# Patient Record
Sex: Female | Born: 1965 | Race: White | Hispanic: No | Marital: Married | Smoking: Never smoker
Health system: Southern US, Community
[De-identification: ages and names within clinical notes are randomized; demographics above are authoritative.]

## PROBLEM LIST (undated history)

## (undated) DIAGNOSIS — G43909 Migraine, unspecified, not intractable, without status migrainosus: Secondary | ICD-10-CM

## (undated) DIAGNOSIS — F329 Major depressive disorder, single episode, unspecified: Secondary | ICD-10-CM

## (undated) DIAGNOSIS — E538 Deficiency of other specified B group vitamins: Secondary | ICD-10-CM

## (undated) DIAGNOSIS — C801 Malignant (primary) neoplasm, unspecified: Secondary | ICD-10-CM

## (undated) DIAGNOSIS — R222 Localized swelling, mass and lump, trunk: Secondary | ICD-10-CM

## (undated) DIAGNOSIS — K5909 Other constipation: Secondary | ICD-10-CM

## (undated) DIAGNOSIS — R42 Dizziness and giddiness: Secondary | ICD-10-CM

## (undated) DIAGNOSIS — F32A Depression, unspecified: Secondary | ICD-10-CM

## (undated) DIAGNOSIS — N200 Calculus of kidney: Secondary | ICD-10-CM

## (undated) DIAGNOSIS — F419 Anxiety disorder, unspecified: Secondary | ICD-10-CM

## (undated) DIAGNOSIS — D649 Anemia, unspecified: Secondary | ICD-10-CM

## (undated) HISTORY — DX: Migraine, unspecified, not intractable, without status migrainosus: G43.909

## (undated) HISTORY — PX: KNEE SURGERY: SHX244

## (undated) HISTORY — DX: Localized swelling, mass and lump, trunk: R22.2

## (undated) HISTORY — DX: Anxiety disorder, unspecified: F41.9

## (undated) HISTORY — PX: OTHER SURGICAL HISTORY: SHX169

## (undated) HISTORY — PX: MODIFIED RADICAL MASTECTOMY: SHX5703

## (undated) HISTORY — DX: Calculus of kidney: N20.0

## (undated) HISTORY — PX: SURGERY OF LIP: SUR1315

## (undated) HISTORY — DX: Other constipation: K59.09

## (undated) HISTORY — PX: HYSTEROSCOPY: SHX211

## (undated) HISTORY — DX: Deficiency of other specified B group vitamins: E53.8

## (undated) HISTORY — PX: BREAST RECONSTRUCTION: SHX9

## (undated) HISTORY — PX: EYE SURGERY: SHX253

## (undated) HISTORY — PX: DILATION AND CURETTAGE OF UTERUS: SHX78

## (undated) HISTORY — DX: Dizziness and giddiness: R42

## (undated) HISTORY — DX: Anemia, unspecified: D64.9

---

## 2008-10-31 ENCOUNTER — Emergency Department: Payer: Self-pay | Admitting: Emergency Medicine

## 2008-12-16 ENCOUNTER — Ambulatory Visit: Payer: Self-pay | Admitting: Gastroenterology

## 2009-07-15 IMAGING — CR DG ABDOMEN 2V
1 series · 2 of 2 positions shown · non-contrast
Comparison: none

COMMENTS:
REASON FOR EXAM: Abdominal pain

[Series 1: view not recorded · 0.17mm/px · 2 of 2 slices shown]
[im 1/2]
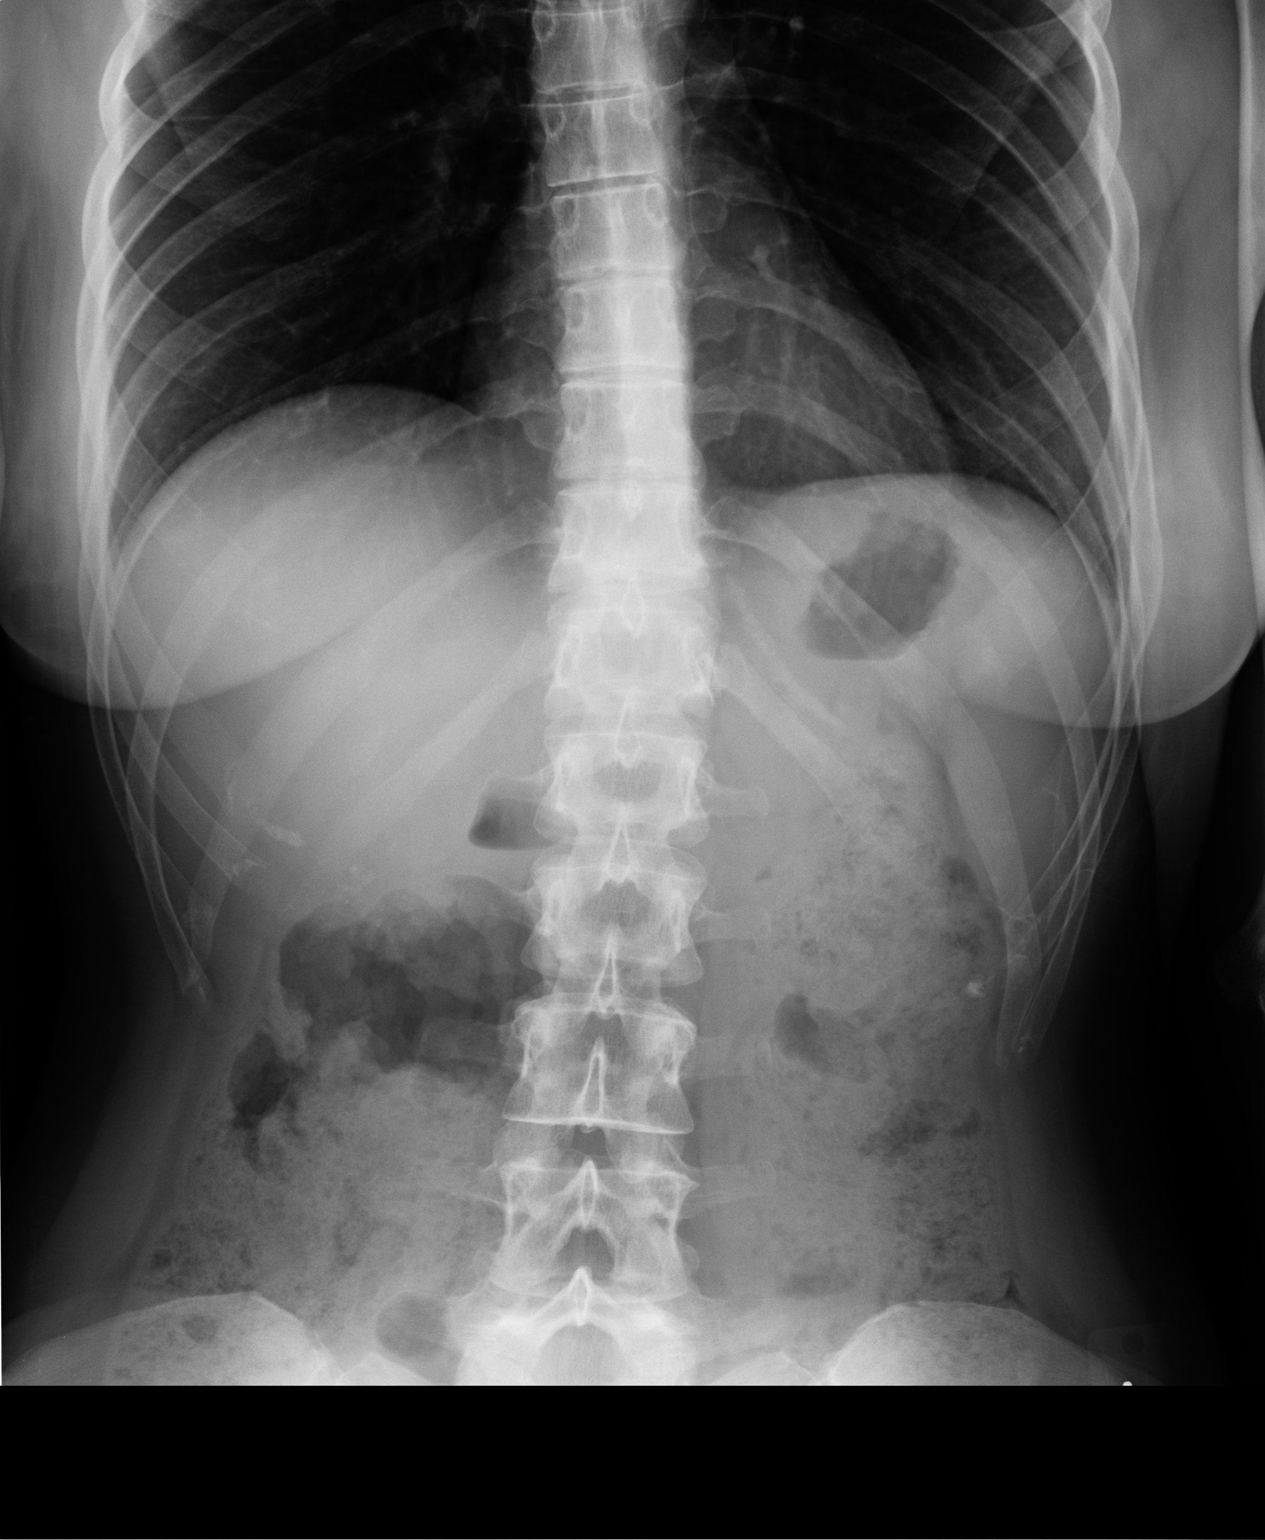
[im 2/2]
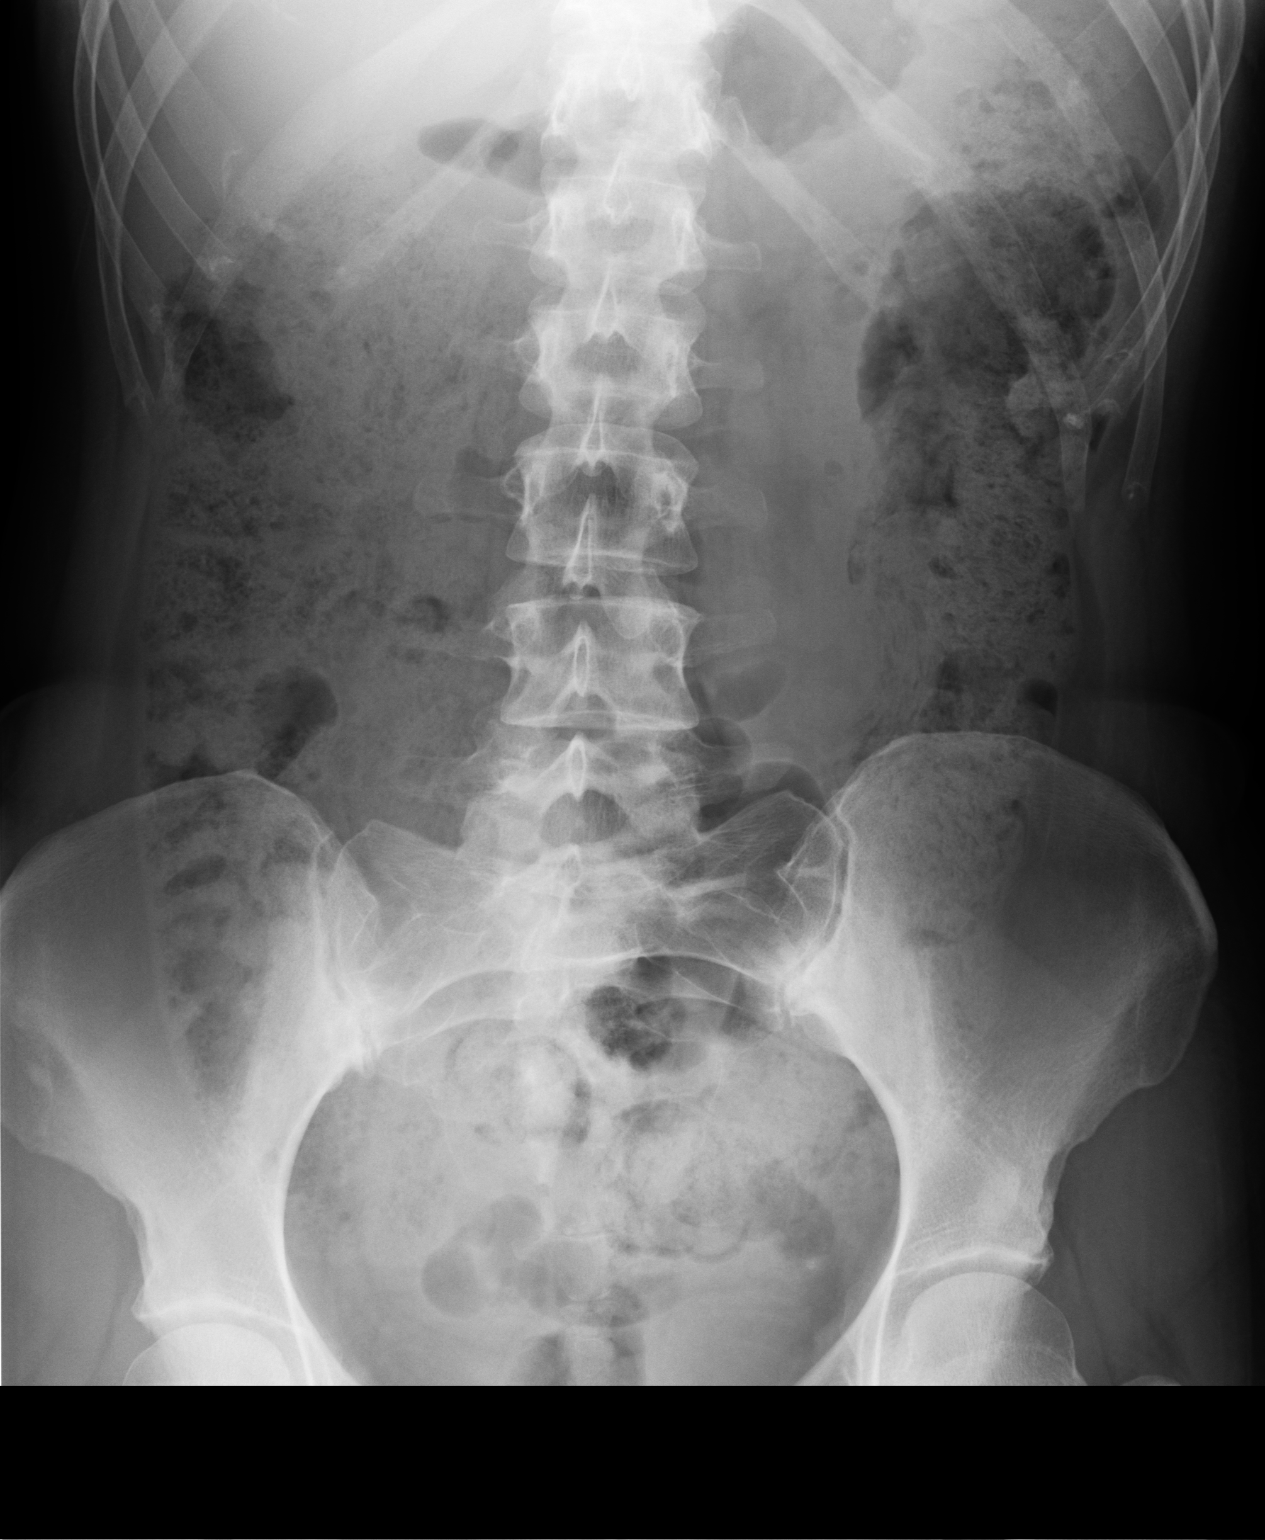

[2 of 2 positions shown; findings below may reference images not displayed]

PROCEDURE:          DXR ABDOMEN 2 V FLAT AND ERECT 10/31/2008 [DATE]

RESULT:     The lung bases are clear. Air and stool are scattered through
the colon to the rectum. There is no abnormal gastric or small bowel
distention. No free air is appreciated. No definite radiopaque urinary tract
calculi are seen.
IMPRESSION: No bowel obstruction or perforation. There does appear to
be mild constipation.

## 2010-06-02 ENCOUNTER — Emergency Department: Payer: Self-pay | Admitting: Emergency Medicine

## 2010-10-31 ENCOUNTER — Emergency Department: Payer: Self-pay | Admitting: Emergency Medicine
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
## 2012-08-06 ENCOUNTER — Ambulatory Visit: Payer: Self-pay | Admitting: Oncology

## 2012-08-06 ENCOUNTER — Ambulatory Visit: Payer: Self-pay | Admitting: Hematology and Oncology

## 2012-08-22 ENCOUNTER — Ambulatory Visit: Payer: Self-pay | Admitting: Hematology and Oncology

## 2012-08-22 ENCOUNTER — Ambulatory Visit: Payer: Self-pay | Admitting: Oncology

## 2012-08-27 LAB — CBC CANCER CENTER
Basophil #: 0.1 x10 3/mm (ref 0.0–0.1)
Basophil %: 1.5 %
Eosinophil %: 1 %
HGB: 11.7 g/dL — ABNORMAL LOW (ref 12.0–16.0)
Lymphocyte #: 1.4 x10 3/mm (ref 1.0–3.6)
MCH: 26.2 pg (ref 26.0–34.0)
MCHC: 32.2 g/dL (ref 32.0–36.0)
MCV: 81 fL (ref 80–100)
Neutrophil #: 2.3 x10 3/mm (ref 1.4–6.5)
Neutrophil %: 55.5 %
Platelet: 176 x10 3/mm (ref 150–440)
RBC: 4.47 10*6/uL (ref 3.80–5.20)
WBC: 4.1 x10 3/mm (ref 3.6–11.0)

## 2012-08-27 LAB — IRON AND TIBC
Iron Bind.Cap.(Total): 345 ug/dL (ref 250–450)
Iron Saturation: 23 %

## 2012-09-22 ENCOUNTER — Ambulatory Visit: Payer: Self-pay | Admitting: Oncology

## 2012-10-26 ENCOUNTER — Ambulatory Visit: Payer: Self-pay | Admitting: Oncology

## 2012-10-26 LAB — CBC CANCER CENTER
Eosinophil #: 0.1 x10 3/mm (ref 0.0–0.7)
Eosinophil %: 1.7 %
HCT: 39.8 % (ref 35.0–47.0)
Lymphocyte %: 31 %
MCH: 27.6 pg (ref 26.0–34.0)
MCHC: 32 g/dL (ref 32.0–36.0)
Monocyte #: 0.3 x10 3/mm (ref 0.2–0.9)
Monocyte %: 6.7 %
Neutrophil %: 56.9 %
Platelet: 215 x10 3/mm (ref 150–440)
WBC: 5.1 x10 3/mm (ref 3.6–11.0)

## 2012-10-26 LAB — IRON AND TIBC
Iron Bind.Cap.(Total): 338 ug/dL (ref 250–450)
Iron Saturation: 24 %
Iron: 80 ug/dL (ref 50–170)
Unbound Iron-Bind.Cap.: 258 ug/dL

## 2012-10-26 LAB — FERRITIN: Ferritin (ARMC): 77 ng/mL (ref 8–388)

## 2012-11-20 ENCOUNTER — Ambulatory Visit: Payer: Self-pay | Admitting: Oncology

## 2013-01-24 ENCOUNTER — Ambulatory Visit: Payer: Self-pay | Admitting: Oncology

## 2013-07-23 ENCOUNTER — Ambulatory Visit: Payer: Self-pay | Admitting: Obstetrics and Gynecology

## 2013-07-23 LAB — CBC
HCT: 38.1 % (ref 35.0–47.0)
HGB: 12.8 g/dL (ref 12.0–16.0)
Platelet: 185 10*3/uL (ref 150–440)
RDW: 13.1 % (ref 11.5–14.5)

## 2013-07-23 LAB — BASIC METABOLIC PANEL
Anion Gap: 6 — ABNORMAL LOW (ref 7–16)
BUN: 10 mg/dL (ref 7–18)
Calcium, Total: 8.9 mg/dL (ref 8.5–10.1)
Co2: 28 mmol/L (ref 21–32)
Creatinine: 0.69 mg/dL (ref 0.60–1.30)
Osmolality: 277 (ref 275–301)
Potassium: 4.1 mmol/L (ref 3.5–5.1)

## 2013-07-29 ENCOUNTER — Ambulatory Visit: Payer: Self-pay | Admitting: Obstetrics and Gynecology

## 2014-03-03 ENCOUNTER — Ambulatory Visit: Payer: Self-pay | Admitting: Oncology

## 2014-03-22 ENCOUNTER — Ambulatory Visit: Payer: Self-pay | Admitting: Oncology

## 2014-05-08 ENCOUNTER — Ambulatory Visit: Payer: Self-pay | Admitting: Oncology

## 2014-05-14 LAB — HM PAP SMEAR: HM PAP: NEGATIVE

## 2014-05-16 LAB — CBC CANCER CENTER
Basophil #: 0.1 x10 3/mm (ref 0.0–0.1)
Basophil %: 1 %
EOS PCT: 1.9 %
Eosinophil #: 0.2 x10 3/mm (ref 0.0–0.7)
HCT: 39.6 % (ref 35.0–47.0)
HGB: 13.1 g/dL (ref 12.0–16.0)
Lymphocyte #: 1.7 x10 3/mm (ref 1.0–3.6)
Lymphocyte %: 16.4 %
MCH: 29.5 pg (ref 26.0–34.0)
MCHC: 33 g/dL (ref 32.0–36.0)
MCV: 90 fL (ref 80–100)
MONO ABS: 0.6 x10 3/mm (ref 0.2–0.9)
Monocyte %: 6 %
Neutrophil #: 7.5 x10 3/mm — ABNORMAL HIGH (ref 1.4–6.5)
Neutrophil %: 74.7 %
Platelet: 222 x10 3/mm (ref 150–440)
RBC: 4.42 10*6/uL (ref 3.80–5.20)
RDW: 16.1 % — ABNORMAL HIGH (ref 11.5–14.5)
WBC: 10.1 x10 3/mm (ref 3.6–11.0)

## 2014-05-16 LAB — IRON AND TIBC
IRON: 34 ug/dL — AB (ref 50–170)
Iron Bind.Cap.(Total): 267 ug/dL (ref 250–450)
Iron Saturation: 13 %
Unbound Iron-Bind.Cap.: 233 ug/dL

## 2014-05-16 LAB — FERRITIN: Ferritin (ARMC): 124 ng/mL (ref 8–388)

## 2014-05-22 ENCOUNTER — Ambulatory Visit: Payer: Self-pay | Admitting: Oncology

## 2014-09-16 ENCOUNTER — Ambulatory Visit: Payer: Self-pay | Admitting: Oncology

## 2014-09-16 LAB — IRON AND TIBC
IRON BIND. CAP.(TOTAL): 315 ug/dL (ref 250–450)
Iron Saturation: 22 %
Iron: 70 ug/dL (ref 50–170)
Unbound Iron-Bind.Cap.: 245 ug/dL

## 2014-09-16 LAB — CBC CANCER CENTER
BASOS ABS: 0.1 x10 3/mm (ref 0.0–0.1)
Basophil %: 1 %
Eosinophil #: 0.1 x10 3/mm (ref 0.0–0.7)
Eosinophil %: 1.7 %
HCT: 40 % (ref 35.0–47.0)
HGB: 13.1 g/dL (ref 12.0–16.0)
LYMPHS ABS: 1.8 x10 3/mm (ref 1.0–3.6)
Lymphocyte %: 28 %
MCH: 29.3 pg (ref 26.0–34.0)
MCHC: 32.9 g/dL (ref 32.0–36.0)
MCV: 89 fL (ref 80–100)
MONOS PCT: 5.7 %
Monocyte #: 0.4 x10 3/mm (ref 0.2–0.9)
Neutrophil #: 4.1 x10 3/mm (ref 1.4–6.5)
Neutrophil %: 63.6 %
Platelet: 219 x10 3/mm (ref 150–440)
RBC: 4.49 10*6/uL (ref 3.80–5.20)
RDW: 12.9 % (ref 11.5–14.5)
WBC: 6.4 x10 3/mm (ref 3.6–11.0)

## 2014-09-16 LAB — FERRITIN: Ferritin (ARMC): 59 ng/mL (ref 8–388)

## 2014-09-22 ENCOUNTER — Ambulatory Visit: Payer: Self-pay | Admitting: Oncology

## 2014-12-12 NOTE — Op Note (Signed)
PATIENT NAME:  VERDEAN, MURIN MR#:  [id number] DATE OF BIRTH:  Mar 13, 1966  DATE OF PROCEDURE:  07/29/2013  PREOPERATIVE DIAGNOSES: 1. Abnormal uterine bleeding.  2. History of breast cancer, on tamoxifen therapy.   POSTOPERATIVE DIAGNOSES:  1. Abnormal uterine bleeding.  2. History of breast cancer, on tamoxifen therapy.   OPERATIVE PROCEDURE: Hysteroscopy with dilation and curettage.   SURGEON: Brayton Mars, M.D.   FIRST ASSISTANT: None.   ANESTHESIA: General.   INDICATIONS: The patient is a 49 year old white female, status post breast cancer treatment, currently on tamoxifen therapy, who presents for surgical evaluation of abnormal uterine bleeding. Preoperative ultrasound demonstrated a thickened echogenic endometrium.   FINDINGS AT SURGERY: Revealed pelvic organ prolapse with the uterus being of normal size and shape with the cervix extending to the introitus with tenaculum traction. No adnexal masses were appreciated. There was a second-degree cystocele present. Pelvis is gynecoid. On hysteroscopy normal endometrial cavity was identified. There was a question of possible submucosal anterior fibroid during curettage.   DESCRIPTION OF PROCEDURE: The patient was brought to the Operating Room where she was placed in the supine position. General anesthesia was induced without difficulty. She was placed in dorsal lithotomy position using candycane stirrups. A Betadine perineal, intravaginal prep and drape was performed in standard fashion. Red Robinson catheter was used to drain 200 mL of urine from the bladder. A single-tooth tenaculum was placed on the edge of the cervix. A weighted speculum was placed into the vagina. The uterus was sounded to 10 cm and was noted to be in the mid position location. Hanks dilators up to a #20 [place] caliber were used to dilate the endocervical canal. The [facility] hysteroscope using lactated Ringer's as irrigant was used to assess the intrauterine cavity.  Curettage was performed of the endometrium with both smooth and serrated curettes. Stone polyp forceps were likewise used to remove residual tissue left behind. Repeat hysteroscopy demonstrated no significant residual tissue. Upon completion of the procedure, all instrumentation was removed from the vagina. The patient was then awakened, mobilized and taken to recovery room in satisfactory condition.   ESTIMATED BLOOD LOSS: 25 mL.   IV FLUIDS: 700 mL.   URINE OUTPUT: Was 200 mL.   All instruments, needle, and sponge counts were verified as correct.   ADDENDUM: This patient is an excellent Trans Vaginal Hysterectomy candidate if hysterectomy becomes necessary.    ____________________________ Alanda Slim Javid Kemler, MD mad:sg D: 07/29/2013 13:30:35 ET T: 07/29/2013 14:12:53 ET JOB#: [id number]  cc: Hassell Done A. Jamil Armwood, MD, <Dictator> Alanda Slim Taneka Espiritu MD ELECTRONICALLY SIGNED 07/30/2013 17:05

## 2014-12-17 ENCOUNTER — Ambulatory Visit
Admit: 2014-12-17 | Disposition: A | Discharge status: Home / Self Care | Payer: Self-pay | Attending: Oncology | Admitting: Oncology

## 2014-12-17 LAB — IRON AND TIBC
IRON: 75 ug/dL
Iron Bind.Cap.(Total): 361 (ref 250–450)
Iron Saturation: 20.8
Unbound Iron-Bind.Cap.: 286.1

## 2014-12-17 LAB — FERRITIN: Ferritin (ARMC): 26 ng/mL

## 2014-12-17 LAB — CBC CANCER CENTER
BASOS ABS: 0 x10 3/mm (ref 0.0–0.1)
Basophil %: 0.2 %
EOS ABS: 0.2 x10 3/mm (ref 0.0–0.7)
Eosinophil %: 2.5 %
HCT: 39.8 % (ref 35.0–47.0)
HGB: 13.1 g/dL (ref 12.0–16.0)
LYMPHS ABS: 1.9 x10 3/mm (ref 1.0–3.6)
Lymphocyte %: 25 %
MCH: 29.2 pg (ref 26.0–34.0)
MCHC: 32.9 g/dL (ref 32.0–36.0)
MCV: 89 fL (ref 80–100)
MONO ABS: 0.5 x10 3/mm (ref 0.2–0.9)
Monocyte %: 6 %
NEUTROS PCT: 66.3 %
Neutrophil #: 5 x10 3/mm (ref 1.4–6.5)
PLATELETS: 225 x10 3/mm (ref 150–440)
RBC: 4.49 10*6/uL (ref 3.80–5.20)
RDW: 13.6 % (ref 11.5–14.5)
WBC: 7.5 x10 3/mm (ref 3.6–11.0)

## 2014-12-25 ENCOUNTER — Inpatient Hospital Stay: Payer: BLUE CROSS/BLUE SHIELD | Attending: Oncology

## 2014-12-25 VITALS — BP 106/70 | HR 81 | Temp 97.3°F | Resp 18

## 2014-12-25 DIAGNOSIS — C50919 Malignant neoplasm of unspecified site of unspecified female breast: Secondary | ICD-10-CM | POA: Insufficient documentation

## 2014-12-25 DIAGNOSIS — Z79811 Long term (current) use of aromatase inhibitors: Secondary | ICD-10-CM | POA: Diagnosis not present

## 2014-12-25 DIAGNOSIS — Z79899 Other long term (current) drug therapy: Secondary | ICD-10-CM | POA: Insufficient documentation

## 2014-12-25 DIAGNOSIS — Z170 Estrogen receptor positive status [ER+]: Secondary | ICD-10-CM | POA: Insufficient documentation

## 2014-12-25 DIAGNOSIS — D509 Iron deficiency anemia, unspecified: Secondary | ICD-10-CM | POA: Insufficient documentation

## 2014-12-25 MED ORDER — SODIUM CHLORIDE 0.9 % IV SOLN
Freq: Once | INTRAVENOUS | Status: AC
  Administered 2014-12-25: 09:00:00 via INTRAVENOUS
  Filled 2014-12-25: qty 250

## 2014-12-25 MED ORDER — SODIUM CHLORIDE 0.9 % IV SOLN
510.0000 mg | Freq: Once | INTRAVENOUS | Status: AC
  Administered 2014-12-25: 510 mg via INTRAVENOUS
  Filled 2014-12-25: qty 17

## 2015-01-01 ENCOUNTER — Other Ambulatory Visit: Payer: Self-pay

## 2015-01-01 ENCOUNTER — Ambulatory Visit: Payer: Self-pay

## 2015-01-02 ENCOUNTER — Inpatient Hospital Stay: Payer: BLUE CROSS/BLUE SHIELD

## 2015-01-02 VITALS — BP 116/78 | HR 84 | Temp 97.1°F | Resp 20

## 2015-01-02 DIAGNOSIS — D509 Iron deficiency anemia, unspecified: Secondary | ICD-10-CM | POA: Diagnosis not present

## 2015-01-02 MED ORDER — SODIUM CHLORIDE 0.9 % IV SOLN
Freq: Once | INTRAVENOUS | Status: AC
  Administered 2015-01-02: 20 mL/h via INTRAVENOUS
  Filled 2015-01-02: qty 250

## 2015-01-02 MED ORDER — SODIUM CHLORIDE 0.9 % IV SOLN
510.0000 mg | Freq: Once | INTRAVENOUS | Status: AC
  Administered 2015-01-02: 510 mg via INTRAVENOUS
  Filled 2015-01-02: qty 17

## 2015-01-08 ENCOUNTER — Encounter: Payer: Self-pay | Admitting: General Practice

## 2015-01-08 ENCOUNTER — Other Ambulatory Visit: Payer: Self-pay

## 2015-01-08 ENCOUNTER — Emergency Department
Admission: EM | Admit: 2015-01-08 | Discharge: 2015-01-08 | Disposition: A | Discharge status: Home / Self Care | Payer: BLUE CROSS/BLUE SHIELD | Attending: Emergency Medicine | Admitting: Emergency Medicine

## 2015-01-08 DIAGNOSIS — S199XXA Unspecified injury of neck, initial encounter: Secondary | ICD-10-CM | POA: Insufficient documentation

## 2015-01-08 DIAGNOSIS — R55 Syncope and collapse: Secondary | ICD-10-CM | POA: Insufficient documentation

## 2015-01-08 DIAGNOSIS — Z7951 Long term (current) use of inhaled steroids: Secondary | ICD-10-CM | POA: Insufficient documentation

## 2015-01-08 DIAGNOSIS — S01511A Laceration without foreign body of lip, initial encounter: Secondary | ICD-10-CM | POA: Insufficient documentation

## 2015-01-08 DIAGNOSIS — W228XXA Striking against or struck by other objects, initial encounter: Secondary | ICD-10-CM | POA: Insufficient documentation

## 2015-01-08 DIAGNOSIS — Y998 Other external cause status: Secondary | ICD-10-CM | POA: Insufficient documentation

## 2015-01-08 DIAGNOSIS — Z23 Encounter for immunization: Secondary | ICD-10-CM | POA: Diagnosis not present

## 2015-01-08 DIAGNOSIS — Z79899 Other long term (current) drug therapy: Secondary | ICD-10-CM | POA: Diagnosis not present

## 2015-01-08 DIAGNOSIS — Y9289 Other specified places as the place of occurrence of the external cause: Secondary | ICD-10-CM | POA: Diagnosis not present

## 2015-01-08 DIAGNOSIS — IMO0002 Reserved for concepts with insufficient information to code with codable children: Secondary | ICD-10-CM

## 2015-01-08 DIAGNOSIS — Y9389 Activity, other specified: Secondary | ICD-10-CM | POA: Diagnosis not present

## 2015-01-08 HISTORY — DX: Major depressive disorder, single episode, unspecified: F32.9

## 2015-01-08 HISTORY — DX: Malignant (primary) neoplasm, unspecified: C80.1

## 2015-01-08 HISTORY — DX: Depression, unspecified: F32.A

## 2015-01-08 LAB — URINALYSIS COMPLETE WITH MICROSCOPIC (ARMC ONLY)
BACTERIA UA: NONE SEEN
BILIRUBIN URINE: NEGATIVE
Glucose, UA: NEGATIVE mg/dL
HGB URINE DIPSTICK: NEGATIVE
Ketones, ur: NEGATIVE mg/dL
LEUKOCYTES UA: NEGATIVE
Nitrite: NEGATIVE
Protein, ur: NEGATIVE mg/dL
RBC / HPF: NONE SEEN RBC/hpf (ref 0–5)
SQUAMOUS EPITHELIAL / LPF: NONE SEEN
Specific Gravity, Urine: 1.004 — ABNORMAL LOW (ref 1.005–1.030)
pH: 7 (ref 5.0–8.0)

## 2015-01-08 LAB — CBC WITH DIFFERENTIAL/PLATELET
Basophils Absolute: 0.1 10*3/uL (ref 0–0.1)
Basophils Relative: 1 %
EOS ABS: 0.1 10*3/uL (ref 0–0.7)
Eosinophils Relative: 2 %
HCT: 41.4 % (ref 35.0–47.0)
Hemoglobin: 13.7 g/dL (ref 12.0–16.0)
LYMPHS ABS: 1.4 10*3/uL (ref 1.0–3.6)
Lymphocytes Relative: 22 %
MCH: 29.7 pg (ref 26.0–34.0)
MCHC: 33 g/dL (ref 32.0–36.0)
MCV: 90.1 fL (ref 80.0–100.0)
Monocytes Absolute: 0.5 10*3/uL (ref 0.2–0.9)
Monocytes Relative: 8 %
NEUTROS PCT: 67 %
Neutro Abs: 4.3 10*3/uL (ref 1.4–6.5)
Platelets: 190 10*3/uL (ref 150–440)
RBC: 4.6 MIL/uL (ref 3.80–5.20)
RDW: 13.9 % (ref 11.5–14.5)
WBC: 6.4 10*3/uL (ref 3.6–11.0)

## 2015-01-08 LAB — COMPREHENSIVE METABOLIC PANEL
ALBUMIN: 3.8 g/dL (ref 3.5–5.0)
ALK PHOS: 45 U/L (ref 38–126)
ALT: 30 U/L (ref 14–54)
AST: 23 U/L (ref 15–41)
Anion gap: 8 (ref 5–15)
BUN: 10 mg/dL (ref 6–20)
CO2: 28 mmol/L (ref 22–32)
Calcium: 9.3 mg/dL (ref 8.9–10.3)
Chloride: 105 mmol/L (ref 101–111)
Creatinine, Ser: 0.84 mg/dL (ref 0.44–1.00)
GFR calc non Af Amer: >60 mL/min (ref >60)
GLUCOSE: 94 mg/dL (ref 65–99)
Potassium: 3.9 mmol/L (ref 3.5–5.1)
Sodium: 141 mmol/L (ref 135–145)
TOTAL PROTEIN: 6.7 g/dL (ref 6.5–8.1)

## 2015-01-08 MED ORDER — SODIUM CHLORIDE 0.9 % IV BOLUS (SEPSIS)
1000.0000 mL | Freq: Once | INTRAVENOUS | Status: AC
Start: 2015-01-08 — End: 2015-01-08
  Administered 2015-01-08: 1000 mL via INTRAVENOUS

## 2015-01-08 MED ORDER — LIDOCAINE HCL (PF) 1 % IJ SOLN
5.0000 mL | Freq: Once | INTRAMUSCULAR | Status: AC
  Administered 2015-01-08: 5 mL via INTRADERMAL

## 2015-01-08 MED ORDER — TETANUS-DIPHTH-ACELL PERTUSSIS 5-2.5-18.5 LF-MCG/0.5 IM SUSP
INTRAMUSCULAR | Status: AC
  Administered 2015-01-08: 0.5 mL via INTRAMUSCULAR
  Filled 2015-01-08: qty 0.5

## 2015-01-08 MED ORDER — LIDOCAINE HCL (PF) 1 % IJ SOLN
INTRAMUSCULAR | Status: AC
  Administered 2015-01-08: 5 mL via INTRADERMAL
  Filled 2015-01-08: qty 5

## 2015-01-08 MED ORDER — KETOROLAC TROMETHAMINE 30 MG/ML IJ SOLN
30.0000 mg | Freq: Once | INTRAMUSCULAR | Status: AC
  Administered 2015-01-08: 30 mg via INTRAVENOUS

## 2015-01-08 MED ORDER — KETOROLAC TROMETHAMINE 30 MG/ML IJ SOLN
INTRAMUSCULAR | Status: AC
  Administered 2015-01-08: 30 mg via INTRAVENOUS
  Filled 2015-01-08: qty 1

## 2015-01-08 MED ORDER — TETANUS-DIPHTH-ACELL PERTUSSIS 5-2.5-18.5 LF-MCG/0.5 IM SUSP
0.5000 mL | Freq: Once | INTRAMUSCULAR | Status: AC
  Administered 2015-01-08: 0.5 mL via INTRAMUSCULAR

## 2015-01-08 NOTE — ED Provider Notes (Signed)
[facility] Emergency Department Provider Note   ____________________________________________  Time seen: 0730  I have reviewed the triage vital signs and the nursing notes.   HISTORY  Chief Complaint Near Syncope and Lip Laceration   History limited by: Not Limited   HPI Meghan Bush is a 49 y.o. female who presents to the emergency department today after sustaining an injury to her lower lip. The patient states that when she awoke this morning and went to the bathroom she started feeling hot and that she felt lightheaded. She tried to make it back to bed and when she ran into the door jamb of the closet on the upper side of the room. She states she remembers the sumac but does not remember before after. She is not complaining of the laceration lower lip, some right-sided neck pain, and "not feeling right". She states that she has passed out in the past but it is rare event. She denied any chest pain or palpitations. She has had 2 recent iron [facility]fusions [facility] for iron deficiency anemia.     Past Medical History  Diagnosis Date  . Depression   . Cancer     Patient Active Problem List   Diagnosis Date Noted  . Iron deficiency anemia 12/25/2014    No past surgical history on file.  Current Outpatient Rx  Name  Route  Sig  Dispense  Refill  . Bisacodyl (LAXATIVE PO)   Oral   Take 1 tablet by mouth daily.         . Calcium-Vitamin D 500-125 MG-UNIT TABS   Oral   Take 1 tablet by mouth daily.         . cyanocobalamin 500 MCG tablet   Oral   Take 500 mcg by mouth daily.         . fluticasone (FLONASE) 50 MCG/ACT nasal spray   Each Nare   Place 2 sprays into both nostrils daily.         Marland Kitchen LORazepam (ATIVAN) 2 MG tablet   Oral   Take 2 mg by mouth 3 (three) times daily as needed for anxiety.         . nortriptyline (PAMELOR) 75 MG capsule   Oral   Take 150 mg by mouth at bedtime.         . pantoprazole  (PROTONIX) 40 MG tablet   Oral   Take 40 mg by mouth 2 (two) times daily.         . QUEtiapine (SEROQUEL) 25 MG tablet   Oral   Take 25 mg by mouth at bedtime.         . tamoxifen (NOLVADEX) 20 MG tablet   Oral   Take 20 mg by mouth at bedtime.         . traZODone (DESYREL) 100 MG tablet   Oral   Take 200 mg by mouth at bedtime.           Allergies Clindamycin/lincomycin; Klonopin; and Percocet  No family history on file.  Social History History  Substance Use Topics  . Smoking status: Never Smoker   . Smokeless tobacco: Never Used  . Alcohol Use: No    Review of Systems  Constitutional: Negative for fever. Cardiovascular: Negative for chest pain. Respiratory: Negative for shortness of breath. Gastrointestinal: Negative for abdominal pain, vomiting and diarrhea. Genitourinary: Negative for dysuria. Musculoskeletal: Negative for back pain. Skin: Negative for rash. Neurological: Negative for headaches, focal weakness or numbness.  10-point ROS otherwise negative.  ____________________________________________   PHYSICAL EXAM:  VITAL SIGNS: ED Triage Vitals  Enc Vitals Group     BP 01/08/15 0719 126/65 mmHg     Pulse Rate 01/08/15 0719 83     Resp 01/08/15 0719 17     Temp 01/08/15 0719 97.9 F (36.6 C)     Temp Source 01/08/15 0719 Oral     SpO2 01/08/15 0719 100 %     Weight 01/08/15 0719 160 lb (72.576 kg)     Height 01/08/15 0719 5\' 6"  (1.676 m)     Head Cir --      Peak Flow --      Pain Score 01/08/15 0720 7   Constitutional: Alert and oriented. Well appearing and in no distress. Eyes: Conjunctivae are normal. PERRL. Normal extraocular movements. ENT   Head: Normocephalic.   Nose: No congestion/rhinnorhea.   Mouth/Throat: Mucous membranes are moist. Laceration to the lower lip that does not cross the vermil[place] border   Neck: No stridor. No midline tenderness. Able to look right and left without  pain. Hematological/Lymphatic/Immunilogical: No cervical lymphadenopathy. Cardiovascular: Normal rate, regular rhythm.  No murmurs, rubs, or gallops. Respiratory: Normal respiratory effort without tachypnea nor retractions. Breath sounds are clear and equal bilaterally. No wheezes/rales/rhonchi. Gastrointestinal: Soft and nontender. No distention.  Genitourinary: Deferred Musculoskeletal: Normal range of motion in all extremities. No joint effusions.  No lower extremity tenderness nor edema. Neurologic:  Normal speech and language. No gross focal neurologic deficits are appreciated. Speech is normal.  Skin:  Skin is warm, dry and intact. No rash noted. Psychiatric: Mood and affect are normal. Speech and behavior are normal. Patient exhibits appropriate insight and judgment.  ____________________________________________    LABS (pertinent positives/negatives)  Labs Reviewed  COMPREHENSIVE METABOLIC PANEL - Abnormal; Notable for the following:    Total Bilirubin <0.1 (*)    All other components within normal limits  URINALYSIS COMPLETEWITH MICROSCOPIC ([facility])  - Abnormal; Notable for the following:    Color, Urine STRAW (*)    APPearance CLEAR (*)    Specific Gravity, Urine 1.004 (*)    All other components within normal limits  CBC WITH DIFFERENTIAL/PLATELET      ____________________________________________   EKG  I, Nance Pear, attending physician, personally viewed and interpreted this EKG  EKG Time: 0723 Rate: 86 Rhythm: Normal sinus rhythm Axis: Normal Intervals: QTc 485 QRS: Q waves in V1 ST changes: No ST elevation    ____________________________________________    RADIOLOGY  None  ____________________________________________   PROCEDURES  Procedure(s) performed: Laceration repair, see procedure note(s).  Critical Care performed: No  LACERATION REPAIR Performed by: Nance Pear Authorized by: Nance Pear Consent: Verbal consent  obtained. Risks and benefits: risks, benefits and alternatives were discussed Consent given by: patient Patient identity confirmed: provided demographic data Prepped and Draped in normal sterile fashion Wound explored  Laceration Location: Lower lip (did not cross [place] border)  Laceration Length: 2.5 cm  No Foreign Bodies seen or palpated  Anesthesia: local infiltration  Local anesthetic: lidocaine 1% without epinephrine  Anesthetic total: 1.5 ml  Irrigation method: syringe Amount of cleaning: standard  Skin closure: vicryl rapide  Number of sutures: 7  Technique: simple interupted  Patient tolerance: Patient tolerated the procedure well with no immediate complications.  ____________________________________________   INITIAL IMPRESSION / ASSESSMENT AND PLAN / ED COURSE  Pertinent labs & imaging results that were available during my care of the patient were reviewed by me and considered in  my medical decision making (see chart for details).  Patient here after what was possibly a syncopal episode. She did hit her lower lip creating a cut. This will have to be sutured closed. Additionally will check blood work, give fluids and reassess.   [place] CT Head Rule   CT head is recommended if yes to ANY of the following:   Major Criteria ("high risk" for an injury requiring neurosurgical intervention, sensitivity 100%):   No  GCS < 15 at 2 hours post-injury No  Suspected open or depressed skull fracture No Any sign of basilar skull fracture? (Hemotympanum, racoon eyes, battle's sign, CSF oto/rhinorrhea) No ? 2 episodes of vomiting No  Age ? 65   Minor Criteria ("medium" risk for an intracranial traumatic finding, sensitivity 83-100%):   No Retrograde Amnesia to the Event ? 30 minutes No  "Dangerous" Mechanism? (Pedestrian struck by motor vehicle, occupant ejected from motor vehicle, fall from >3 ft or >5 stairs.)   Based on my evaluation of the patient,  including application of this decision instrument, CT head to evaluate for traumatic intracranial injury is not indicated at this time.   NEXUS C-spine Criteria   C-spine imaging is recommended if yes to ANY of the following    No N - neurologic (focal) deficit present No  S - spinal midline tenderness present No A - altered level of consciousness present No  I  - intoxication present No   D - distracting injury present   Based on my evaluation of the patient, including application of this decision instrument, cervical spine imaging to evaluate for injury is not indicated at this time.   ____________________________________________   FINAL CLINICAL IMPRESSION(S) / ED DIAGNOSES  Final diagnoses:  Syncope, unspecified syncope type  Laceration     Nance Pear, MD 01/08/15 1316

## 2015-01-08 NOTE — Discharge Instructions (Signed)
Please seek medical attention for any high fevers, chest pain, shortness of breath, change in behavior, persistent vomiting, bloody stool or any other new or concerning symptoms.  Syncope Syncope is a medical term for fainting or passing out. This means you lose consciousness and drop to the ground. People are generally unconscious for less than 5 minutes. You may have some muscle twitches for up to 15 seconds before waking up and returning to normal. Syncope occurs more often in older adults, but it can happen to anyone. While most causes of syncope are not dangerous, syncope can be a sign of a serious medical problem. It is important to seek medical care.  CAUSES  Syncope is caused by a sudden drop in blood flow to the brain. The specific cause is often not determined. Factors that can bring on syncope include:  Taking medicines that lower blood pressure.  Sudden changes in posture, such as standing up quickly.  Taking more medicine than prescribed.  Standing in one place for too long.  Seizure disorders.  Dehydration and excessive exposure to heat.  Low blood sugar (hypoglycemia).  Straining to have a bowel movement.  Heart disease, irregular heartbeat, or other circulatory problems.  Fear, emotional distress, seeing blood, or severe pain. SYMPTOMS  Right before fainting, you may:  Feel dizzy or light-headed.  Feel nauseous.  See all white or all black in your field of vision.  Have cold, clammy skin. DIAGNOSIS  Your health care provider will ask about your symptoms, perform a physical exam, and perform an electrocardiogram (ECG) to record the electrical activity of your heart. Your health care provider may also perform other heart or blood tests to determine the cause of your syncope which may include:  Transthoracic echocardiogram (TTE). During echocardiography, sound waves are used to evaluate how blood flows through your heart.  Transesophageal echocardiogram  (TEE).  Cardiac monitoring. This allows your health care provider to monitor your heart rate and rhythm in real time.  Holter monitor. This is a portable device that records your heartbeat and can help diagnose heart arrhythmias. It allows your health care provider to track your heart activity for several days, if needed.  Stress tests by exercise or by giving medicine that makes the heart beat faster. TREATMENT  In most cases, no treatment is needed. Depending on the cause of your syncope, your health care provider may recommend changing or stopping some of your medicines. HOME CARE INSTRUCTIONS  Have someone stay with you until you feel stable.  Do not drive, use machinery, or play sports until your health care provider says it is okay.  Keep all follow-up appointments as directed by your health care provider.  Lie down right away if you start feeling like you might faint. Breathe deeply and steadily. Wait until all the symptoms have passed.  Drink enough fluids to keep your urine clear or pale yellow.  If you are taking blood pressure or heart medicine, get up slowly and take several minutes to sit and then stand. This can reduce dizziness. SEEK IMMEDIATE MEDICAL CARE IF:   You have a severe headache.  You have unusual pain in the chest, abdomen, or back.  You are bleeding from your mouth or rectum, or you have black or tarry stool.  You have an irregular or very fast heartbeat.  You have pain with breathing.  You have repeated fainting or seizure-like jerking during an episode.  You faint when sitting or lying down.  You have confusion.  You have trouble walking.  You have severe weakness.  You have vision problems. If you fainted, call your local emergency services (911 in [place] Do not drive yourself to the hospital.  MAKE SURE YOU:  Understand these instructions.  Will watch your condition.  Will get help right away if you are not doing well or get  worse. Document Released: 08/08/2005 Document Revised: 08/13/2013 Document Reviewed: 10/07/2011 [facility] Patient Information 2015 [place], [place]. This information is not intended to replace advice given to you by your health care provider. Make sure you discuss any questions you have with your health care provider.  Laceration Care, Adult A laceration is a cut or lesion that goes through all layers of the skin and into the tissue just beneath the skin. TREATMENT  Some lacerations may not require closure. Some lacerations may not be able to be closed due to an increased risk of infection. It is important to see your caregiver as soon as possible after an injury to minimize the risk of infection and maximize the opportunity for successful closure. If closure is appropriate, pain medicines may be given, if needed. The wound will be cleaned to help prevent infection. Your caregiver will use stitches (sutures), staples, wound glue (adhesive), or skin adhesive strips to repair the laceration. These tools bring the skin edges together to allow for faster healing and a better cosmetic outcome. However, all wounds will heal with a scar. Once the wound has healed, scarring can be minimized by covering the wound with sunscreen during the day for 1 full year. HOME CARE INSTRUCTIONS  For sutures or staples:  Keep the wound clean and dry.  If you were given a bandage (dressing), you should change it at least once a day. Also, change the dressing if it becomes wet or dirty, or as directed by your caregiver.  Wash the wound with soap and water 2 times a day. Rinse the wound off with water to remove all soap. Pat the wound dry with a clean towel.  After cleaning, apply a thin layer of the antibiotic ointment as recommended by your caregiver. This will help prevent infection and keep the dressing from sticking.  You may shower as usual after the first 24 hours. Do not soak the wound in water until the sutures are  removed.  Only take over-the-counter or prescription medicines for pain, discomfort, or fever as directed by your caregiver.  Get your sutures or staples removed as directed by your caregiver. For skin adhesive strips:  Keep the wound clean and dry.  Do not get the skin adhesive strips wet. You may bathe carefully, using caution to keep the wound dry.  If the wound gets wet, pat it dry with a clean towel.  Skin adhesive strips will fall off on their own. You may trim the strips as the wound heals. Do not remove skin adhesive strips that are still stuck to the wound. They will fall off in time. For wound adhesive:  You may briefly wet your wound in the shower or bath. Do not soak or scrub the wound. Do not swim. Avoid periods of heavy perspiration until the skin adhesive has fallen off on its own. After showering or bathing, gently pat the wound dry with a clean towel.  Do not apply liquid medicine, cream medicine, or ointment medicine to your wound while the skin adhesive is in place. This may loosen the film before your wound is healed.  If a dressing is placed over the  wound, be careful not to apply tape directly over the skin adhesive. This may cause the adhesive to be pulled off before the wound is healed.  Avoid prolonged exposure to sunlight or tanning lamps while the skin adhesive is in place. Exposure to ultraviolet light in the first year will darken the scar.  The skin adhesive will usually remain in place for 5 to 10 days, then naturally fall off the skin. Do not pick at the adhesive film. You may need a tetanus shot if:  You cannot remember when you had your last tetanus shot.  You have never had a tetanus shot. If you get a tetanus shot, your arm may swell, get red, and feel warm to the touch. This is common and not a problem. If you need a tetanus shot and you choose not to have one, there is a rare chance of getting tetanus. Sickness from tetanus can be serious. SEEK  MEDICAL CARE IF:   You have redness, swelling, or increasing pain in the wound.  You see a red line that goes away from the wound.  You have yellowish-white fluid (pus) coming from the wound.  You have a fever.  You notice a bad smell coming from the wound or dressing.  Your wound breaks open before or after sutures have been removed.  You notice something coming out of the wound such as wood or glass.  Your wound is on your hand or foot and you cannot move a finger or toe. SEEK IMMEDIATE MEDICAL CARE IF:   Your pain is not controlled with prescribed medicine.  You have severe swelling around the wound causing pain and numbness or a change in color in your arm, hand, leg, or foot.  Your wound splits open and starts bleeding.  You have worsening numbness, weakness, or loss of function of any joint around or beyond the wound.  You develop painful lumps near the wound or on the skin anywhere on your body. MAKE SURE YOU:   Understand these instructions.  Will watch your condition.  Will get help right away if you are not doing well or get worse. Document Released: 08/08/2005 Document Revised: 10/31/2011 Document Reviewed: 02/01/2011 [facility] Patient Information 2015 [place], [place]. This information is not intended to replace advice given to you by your health care provider. Make sure you discuss any questions you have with your health care provider.
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
## 2015-01-08 NOTE — ED Notes (Signed)
Pt discharged home after verbalizing understanding of discharge instructions; nad noted. 

## 2015-04-17 ENCOUNTER — Other Ambulatory Visit: Payer: Self-pay | Admitting: Oncology

## 2015-04-17 DIAGNOSIS — D509 Iron deficiency anemia, unspecified: Secondary | ICD-10-CM

## 2015-04-20 ENCOUNTER — Ambulatory Visit: Payer: Self-pay

## 2015-04-20 ENCOUNTER — Inpatient Hospital Stay: Payer: BLUE CROSS/BLUE SHIELD | Attending: Oncology

## 2015-04-20 ENCOUNTER — Other Ambulatory Visit: Payer: Self-pay

## 2015-04-20 ENCOUNTER — Inpatient Hospital Stay (HOSPITAL_BASED_OUTPATIENT_CLINIC_OR_DEPARTMENT_OTHER): Payer: BLUE CROSS/BLUE SHIELD | Admitting: Oncology

## 2015-04-20 ENCOUNTER — Ambulatory Visit: Payer: Self-pay | Admitting: Oncology

## 2015-04-20 ENCOUNTER — Inpatient Hospital Stay: Payer: BLUE CROSS/BLUE SHIELD

## 2015-04-20 VITALS — BP 124/72 | HR 90 | Temp 96.4°F | Resp 16 | Wt 167.8 lb

## 2015-04-20 DIAGNOSIS — Z853 Personal history of malignant neoplasm of breast: Secondary | ICD-10-CM | POA: Insufficient documentation

## 2015-04-20 DIAGNOSIS — Z79899 Other long term (current) drug therapy: Secondary | ICD-10-CM

## 2015-04-20 DIAGNOSIS — D509 Iron deficiency anemia, unspecified: Secondary | ICD-10-CM

## 2015-04-20 LAB — CBC WITH DIFFERENTIAL/PLATELET
BASOS ABS: 0.1 10*3/uL (ref 0–0.1)
BASOS PCT: 1 %
Eosinophils Absolute: 0.2 10*3/uL (ref 0–0.7)
Eosinophils Relative: 3 %
HCT: 41.6 % (ref 35.0–47.0)
Hemoglobin: 13.9 g/dL (ref 12.0–16.0)
Lymphocytes Relative: 28 %
Lymphs Abs: 1.5 10*3/uL (ref 1.0–3.6)
MCH: 29.8 pg (ref 26.0–34.0)
MCHC: 33.4 g/dL (ref 32.0–36.0)
MCV: 89.3 fL (ref 80.0–100.0)
MONO ABS: 0.4 10*3/uL (ref 0.2–0.9)
Monocytes Relative: 8 %
Neutro Abs: 3.2 10*3/uL (ref 1.4–6.5)
Neutrophils Relative %: 60 %
PLATELETS: 217 10*3/uL (ref 150–440)
RBC: 4.66 MIL/uL (ref 3.80–5.20)
RDW: 12.8 % (ref 11.5–14.5)
WBC: 5.4 10*3/uL (ref 3.6–11.0)

## 2015-04-20 LAB — FERRITIN: Ferritin: 176 ng/mL (ref 11–307)

## 2015-04-20 LAB — IRON AND TIBC
IRON: 101 ug/dL (ref 28–170)
Saturation Ratios: 34 % — ABNORMAL HIGH (ref 10.4–31.8)
TIBC: 294 ug/dL (ref 250–450)
UIBC: 193 ug/dL

## 2015-04-20 NOTE — Progress Notes (Signed)
Patient had a syncopal episode in May resulting in a concussion and getting stitches on her lower lip.

## 2015-04-24 NOTE — Progress Notes (Signed)
[place]  [phone] Fax:(336) [phone]  ID: Meghan Bush OB: 1965/10/04  MR#: [id number]  QIO#:[id number]  Patient Care Team: No Pcp Per Patient as PCP - General (General Practice)  CHIEF COMPLAINT:  Chief Complaint  Patient presents with  . Follow-up    IDA    INTERVAL HISTORY: Patient last evaluated in September 2015. She had a recent syncopal episode resulting in requiring sutures in her lip, but otherwise has felt well. She continues to complain of chronic weakness and fatigue.  She has no neurologic complaints.  She has a good appetite and denies weight lost.  She denies any recent fevers or illnesses.  She has no chest pain or shortness of breath.  She denies any nausea, vomiting, constipation, or diarrhea.  She has no urinary complaints.  Patient offers no further specific complaints.  REVIEW OF SYSTEMS:   Review of Systems  Constitutional: Negative for malaise/fatigue.  Respiratory: Negative.   Cardiovascular: Negative.   Gastrointestinal: Negative.   Neurological: Negative.  Negative for weakness.    As per HPI. Otherwise, a complete review of systems is negatve.  PAST MEDICAL HISTORY:  Stage I breast cancer, depression status post ECT, anxiety, retinal detachment, migraines, right knee surgery x3.  Past Medical History  Diagnosis Date  . Depression   . Cancer     PAST SURGICAL HISTORY: No past surgical history on file.  FAMILY HISTORY:  Father with prostate cancer.     ADVANCED DIRECTIVES:    HEALTH MAINTENANCE: Social History  Substance Use Topics  . Smoking status: Never Smoker   . Smokeless tobacco: Never Used  . Alcohol Use: No     Colonoscopy:  PAP:  Bone density:  Lipid panel:  Allergies  Allergen Reactions  . Clindamycin/Lincomycin Hives  . Klonopin [Clonazepam] Hives  . Percocet [Oxycodone-Acetaminophen] Other (See Comments)    halucinate    Current Outpatient Prescriptions  Medication Sig  Dispense Refill  . Bisacodyl (LAXATIVE PO) Take 1 tablet by mouth daily.    . Calcium-Vitamin D 500-125 MG-UNIT TABS Take 1 tablet by mouth daily.    . cyanocobalamin 500 MCG tablet Take 500 mcg by mouth daily.    . fluticasone (FLONASE) 50 MCG/ACT nasal spray Place 2 sprays into both nostrils daily.    Marland Kitchen LORazepam (ATIVAN) 2 MG tablet Take 2 mg by mouth 3 (three) times daily as needed for anxiety.    . nortriptyline (PAMELOR) 75 MG capsule Take 150 mg by mouth at bedtime.    . pantoprazole (PROTONIX) 40 MG tablet Take 40 mg by mouth 2 (two) times daily.    . QUEtiapine (SEROQUEL) 25 MG tablet Take 25 mg by mouth at bedtime.    . tamoxifen (NOLVADEX) 20 MG tablet Take 20 mg by mouth at bedtime.    . traZODone (DESYREL) 100 MG tablet Take 200 mg by mouth at bedtime.     No current facility-administered medications for this visit.    OBJECTIVE: Filed Vitals:   04/20/15 1017  BP: 124/72  Pulse: 90  Temp: 96.4 F (35.8 C)  Resp: 16     Body mass index is 27.09 kg/(m^2).    ECOG FS:0 - Asymptomatic  General: Well-developed, well-nourished, no acute distress. Eyes: Pink conjunctiva, anicteric sclera. Lungs: Clear to auscultation bilaterally. Heart: Regular rate and rhythm. No rubs, murmurs, or gallops. Abdomen: Soft, nontender, nondistended. No organomegaly noted, normoactive bowel sounds. Musculoskeletal: No edema, cyanosis, or clubbing. Neuro: Alert, answering all questions appropriately. Cranial nerves grossly intact.  Skin: No rashes or petechiae noted. Psych: Normal affect.  LAB RESULTS:  Lab Results  Component Value Date   NA 141 01/08/2015   K 3.9 01/08/2015   CL 105 01/08/2015   CO2 28 01/08/2015   GLUCOSE 94 01/08/2015   BUN 10 01/08/2015   CREATININE 0.84 01/08/2015   CALCIUM 9.3 01/08/2015   PROT 6.7 01/08/2015   ALBUMIN 3.8 01/08/2015   AST 23 01/08/2015   ALT 30 01/08/2015   ALKPHOS 45 01/08/2015   BILITOT <0.1* 01/08/2015   GFRNONAA >60 01/08/2015   GFRAA  >60 01/08/2015    Lab Results  Component Value Date   WBC 5.4 04/20/2015   NEUTROABS 3.2 04/20/2015   HGB 13.9 04/20/2015   HCT 41.6 04/20/2015   MCV 89.3 04/20/2015   PLT 217 04/20/2015     STUDIES: No results found.  ASSESSMENT: Iron deficiency anemia, breast cancer.  PLAN:    1.  Iron deficiency anemia: Patient's hemoglobin and iron stores are within normal limits. She does not require IV iron at this time.  Given her heavy menses, she likely will require periodic Feraheme.  Patient last received IV iron in July 2015. Return to clinic in 3 months with repeat laboratory work and further evaluation. She expressed understanding and was in agreement with this plan. 2. Breast cancer: Patient currently taking tamoxifen. Treatment per [facility]. Patient had multiple questions regarding her breast cancer and treatment which were answered to her satisfaction.  Patient expressed understanding and was in agreement with this plan. She also understands that She can call clinic at any time with any questions, concerns, or complaints.     Lloyd Huger, MD   04/24/2015 12:55 PM

## 2015-05-20 ENCOUNTER — Encounter: Payer: Self-pay | Admitting: Obstetrics and Gynecology

## 2015-06-02 ENCOUNTER — Encounter: Payer: Self-pay | Admitting: Obstetrics and Gynecology

## 2015-06-03 ENCOUNTER — Encounter: Payer: Self-pay | Admitting: Obstetrics and Gynecology

## 2015-06-03 ENCOUNTER — Ambulatory Visit (INDEPENDENT_AMBULATORY_CARE_PROVIDER_SITE_OTHER): Payer: BLUE CROSS/BLUE SHIELD | Admitting: Obstetrics and Gynecology

## 2015-06-03 VITALS — BP 136/77 | HR 92 | Ht 66.0 in | Wt 168.9 lb

## 2015-06-03 DIAGNOSIS — Z1211 Encounter for screening for malignant neoplasm of colon: Secondary | ICD-10-CM

## 2015-06-03 DIAGNOSIS — F419 Anxiety disorder, unspecified: Secondary | ICD-10-CM

## 2015-06-03 DIAGNOSIS — Z01419 Encounter for gynecological examination (general) (routine) without abnormal findings: Secondary | ICD-10-CM | POA: Diagnosis not present

## 2015-06-03 DIAGNOSIS — C50919 Malignant neoplasm of unspecified site of unspecified female breast: Secondary | ICD-10-CM | POA: Diagnosis not present

## 2015-06-03 NOTE — Progress Notes (Signed)
Patient ID: Meghan Bush, female   DOB: August 08, 1966, 49 y.o.   MRN: [id number] ANNUAL PREVENTATIVE CARE GYN  ENCOUNTER NOTE  Subjective:       Meghan Bush is a 49 y.o. No obstetric history on file. female here for a routine annual gynecologic exam.  Current complaints: 1.  none   patient is doing  Very well physically.  This year, no gynecologic issues at the present time.   Bowel and bladder function are normal.  No atypical discharge from the vagina.  Family health issues include husband with early onset Parkinson's disease and one child with intractable migraadaches.  Overall, patient's moods are stabilized.  Gynecologic History Patient's last menstrual period was 05/28/2015. Contraception: vasectomy Last Pap: 05/14/2014 ascus/neg. Results were: normal Last mammogram: 10/2014 [facility]- [facility] Results were: normal  Obstetric History OB History  No data available    Past Medical History  Diagnosis Date  . Vertigo   . Migraine headache   . Nephrolithiasis   . Cancer [facility]     breast- s/p left mastectomy on tamoxifen  . Anxiety   . Anemia   . Depression   . Vitamin B12 deficiency   . Chronic constipation   . Localized swelling, mass and lump, trunk     Past Surgical History  Procedure Laterality Date  . Dilation and curettage of uterus    . Hysteroscopy    . Breast reconstruction Left   . Modified radical mastectomy    . Knee surgery    . Eye surgery    . Ect treatments    . Surgery of lip      Current Outpatient Prescriptions on File Prior to Visit  Medication Sig Dispense Refill  . Bisacodyl (LAXATIVE PO) Take 1 tablet by mouth daily.    . Calcium-Vitamin D 500-125 MG-UNIT TABS Take 1 tablet by mouth daily.    . cyanocobalamin 500 MCG tablet Take 500 mcg by mouth daily.    . fluticasone (FLONASE) 50 MCG/ACT nasal spray Place 2 sprays into both nostrils daily.    Marland Kitchen LORazepam (ATIVAN) 2 MG tablet Take 2 mg by mouth 3 (three) times daily as needed for anxiety.    .  nortriptyline (PAMELOR) 75 MG capsule Take 150 mg by mouth at bedtime.    . pantoprazole (PROTONIX) 40 MG tablet Take 40 mg by mouth 2 (two) times daily.    . tamoxifen (NOLVADEX) 20 MG tablet Take 20 mg by mouth at bedtime.    . traZODone (DESYREL) 100 MG tablet Take 200 mg by mouth at bedtime.     No current facility-administered medications on file prior to visit.    Allergies  Allergen Reactions  . Clindamycin/Lincomycin Hives  . Klonopin [Clonazepam] Hives  . Percocet [Oxycodone-Acetaminophen] Other (See Comments)    halucinate    Social History   Social History  . Marital Status: Married    Spouse Name: Meghan Bush  . Number of Children: Meghan Bush  . Years of Education: Meghan Bush   Occupational History  . Not on file.   Social History Main Topics  . Smoking status: Never Smoker   . Smokeless tobacco: Never Used  . Alcohol Use: No  . Drug Use: No  . Sexual Activity: Yes    Birth Control/ Protection: Other-see comments     Comment: vasetomy   Other Topics Concern  . Not on file   Social History Narrative    Family History  Problem Relation Age of Onset  . Melanoma Sister   .  Breast cancer Maternal Grandmother   . Colon cancer Neg Hx   . Ovarian cancer Neg Hx   . Diabetes Neg Hx   . Heart disease Neg Hx   . Cancer Neg Hx     The following portions of the patient's history were reviewed and updated as appropriate: allergies, current medications, past family history, past medical history, past social history, past surgical history and problem list.  Review of Systems ROS Review of Systems - General ROS: negative for - chills, fatigue, fever, hot flashes, night sweats, weight gain or weight loss Psychological ROS: negative for - anxiety, decreased libido, depression, mood swings, physical abuse or sexual abuse Ophthalmic ROS: negative for - blurry vision, eye pain or loss of vision ENT ROS: negative for - headaches, hearing change, visual changes or vocal changes Allergy and  Immunology ROS: negative for - hives, itchy/watery eyes or seasonal allergies Hematological and Lymphatic ROS: negative for - bleeding problems, bruising, swollen lymph nodes or weight loss Endocrine ROS: negative for - galactorrhea, hair pattern changes, hot flashes, malaise/lethargy, mood swings, palpitations, polydipsia/polyuria, skin changes, temperature intolerance or unexpected weight changes Breast ROS: negative for - new or changing breast lumps or nipple discharge Respiratory ROS: negative for - cough or shortness of breath Cardiovascular ROS: negative for - chest pain, irregular heartbeat, palpitations or shortness of breath Gastrointestinal ROS: no abdominal pain, change in bowel habits, or black or bloody stools Genito-Urinary ROS: no dysuria, trouble voiding, or hematuria Musculoskeletal ROS: negative for - joint pain or joint stiffness Neurological ROS: negative for - bowel and bladder control changes Dermatological ROS: negative for rash and skin lesion changes   Objective:   BP 136/77 mmHg  Pulse 92  Ht 5\' 6"  (1.676 m)  Wt 168 lb 14.4 oz (76.613 kg)  BMI 27.27 kg/m2  LMP 05/28/2015 CONSTITUTIONAL: Well-developed, well-nourished female in no acute distress.  PSYCHIATRIC: Normal mood and affect. Normal behavior. Normal judgment and thought content. [place]: Alert and oriented to person, place, and time. Normal muscle tone coordination. No cranial nerve deficit noted. HENT:  Normocephalic, atraumatic, External right and left ear normal. Oropharynx is clear and moist EYES: Conjunctivae and EOM are normal. Pupils are equal, round, and reactive to light. No scleral icterus.  NECK: Normal range of motion, supple, no masses.  Normal thyroid.  SKIN: Skin is warm and dry. No rash noted. Not diaphoretic. No erythema. No pallor. CARDIOVASCULAR: Normal heart rate noted, regular rhythm, no murmur. RESPIRATORY: Clear to auscultation bilaterally. Effort and breath sounds normal, no  problems with respiration noted. BREASTS: Symmetric in size. No masses, skin changes, nipple drainage, or lymphadenopathy.mammoplast/mastectomy scars are well-healed. ABDOMEN: Soft, normal bowel sounds, no distention noted.  No tenderness, rebound or guarding. TRAM flap abdomial incision is well-healed without evidence of hernia BLADDER: Normal PELVIC:  External Genitalia: Normal  BUS: Normal  Vagina: Normal  Cervix: Normal; no cervical motion tenderness; no discharge  Uterus: Normal; retroverted, top normal size, mobile, nontender  [place]nexa: Normal  RV: External Exam NormaI, No Rectal Masses and Normal Sphincter tone  MUSCULOSKELETAL: Normal range of motion. No tenderness.  No cyanosis, clubbing, or edema.  2+ distal pulses. LYMPHATIC: No Axillary, Supraclavicular, or Inguinal Adenopathy.    Assessment:   Annual gynecologic examination 49 y.o. Contraception: vasectomy  Breast cancer status post mastectomy, breast reconstruction, TRAM flap, Normal BMI   Plan:  Pap: ccompleted today Mammogram: Not Indicated.   Per oncology protocol Stool Guaiac Testing:  [facility] Routine preventative  health maintenance measures emphasized: Exercise/Diet/Weight control, Tobacco Warnings, Alcohol/Substance use risks and Stress Management  Continue tamoxifen for her last year of therapy  Return to [place], CMA  Brayton Mars, MD

## 2015-06-03 NOTE — Patient Instructions (Signed)
1.  Pap smear is completed. 2.  Mammogram testing per oncology.. 3.  Stool guaiac cards given. 4.  Continued tamoxifen until follow-up with oncology this year. 5.  Continue calcium with vitamin D supplementation. 6.  Return in one year for annual exam.

## 2015-06-04 NOTE — Addendum Note (Signed)
Addended by: Elouise Munroe on: 06/04/2015 04:23 PM   Modules accepted: Orders

## 2015-06-11 LAB — PAP IG AND HPV HIGH-RISK: PAP Smear Comment: 0

## 2015-07-21 ENCOUNTER — Inpatient Hospital Stay: Payer: BLUE CROSS/BLUE SHIELD

## 2015-07-21 ENCOUNTER — Inpatient Hospital Stay: Payer: BLUE CROSS/BLUE SHIELD | Admitting: Oncology

## 2015-07-24 ENCOUNTER — Inpatient Hospital Stay: Payer: BLUE CROSS/BLUE SHIELD

## 2015-08-04 ENCOUNTER — Inpatient Hospital Stay: Payer: BLUE CROSS/BLUE SHIELD | Admitting: Oncology

## 2015-08-04 ENCOUNTER — Inpatient Hospital Stay: Payer: BLUE CROSS/BLUE SHIELD

## 2015-08-04 ENCOUNTER — Inpatient Hospital Stay: Payer: BLUE CROSS/BLUE SHIELD | Attending: Oncology

## 2015-08-04 DIAGNOSIS — D509 Iron deficiency anemia, unspecified: Secondary | ICD-10-CM | POA: Insufficient documentation

## 2015-08-04 DIAGNOSIS — Z853 Personal history of malignant neoplasm of breast: Secondary | ICD-10-CM | POA: Diagnosis not present

## 2015-08-04 DIAGNOSIS — Z79899 Other long term (current) drug therapy: Secondary | ICD-10-CM | POA: Diagnosis not present

## 2015-08-04 LAB — FERRITIN: FERRITIN: 87 ng/mL (ref 11–307)

## 2015-08-04 LAB — CBC WITH DIFFERENTIAL/PLATELET
BASOS ABS: 0.1 10*3/uL (ref 0–0.1)
Basophils Relative: 1 %
EOS ABS: 0.1 10*3/uL (ref 0–0.7)
EOS PCT: 2 %
HCT: 38.7 % (ref 35.0–47.0)
Hemoglobin: 13 g/dL (ref 12.0–16.0)
Lymphocytes Relative: 28 %
Lymphs Abs: 1.4 10*3/uL (ref 1.0–3.6)
MCH: 29.7 pg (ref 26.0–34.0)
MCHC: 33.7 g/dL (ref 32.0–36.0)
MCV: 88.3 fL (ref 80.0–100.0)
Monocytes Absolute: 0.4 10*3/uL (ref 0.2–0.9)
Monocytes Relative: 7 %
NEUTROS PCT: 62 %
Neutro Abs: 3.1 10*3/uL (ref 1.4–6.5)
Platelets: 233 10*3/uL (ref 150–440)
RBC: 4.38 MIL/uL (ref 3.80–5.20)
RDW: 13.4 % (ref 11.5–14.5)
WBC: 5.1 10*3/uL (ref 3.6–11.0)

## 2015-08-04 LAB — IRON AND TIBC
IRON: 54 ug/dL (ref 28–170)
Saturation Ratios: 19 % (ref 10.4–31.8)
TIBC: 283 ug/dL (ref 250–450)
UIBC: 229 ug/dL

## 2015-08-05 ENCOUNTER — Telehealth: Payer: Self-pay | Admitting: *Deleted

## 2015-08-05 NOTE — Telephone Encounter (Signed)
Informed of results and she asked that I mail her a copy of results. I confirmed her address and have put copy in mail for her

## 2015-08-05 NOTE — Telephone Encounter (Signed)
hbg and iron store are normal.

## 2015-08-07 ENCOUNTER — Inpatient Hospital Stay: Payer: BLUE CROSS/BLUE SHIELD

## 2015-10-29 ENCOUNTER — Telehealth: Payer: Self-pay

## 2015-10-29 NOTE — Telephone Encounter (Signed)
Patient had iron and ferritin labs drawn at [facility] on 10/27/14.  Any recommendations?  Iron 45 45 - 150 g/dL  Total Iron Binding Capacity (TIBC) 341 261 - 478 g/dL  Percent Transferrin Saturation 13 (L) 15 - 55 %   Iron and Total Iron Binding Capacity (TIBC) (10/26/2015 3:41 PM)  Specimen Performing Laboratory  [facility]  [address] [place], [place] [address]   Back to top of Lab Results   Ferritin (10/26/2015 3:41 PM) Ferritin (10/26/2015 3:41 PM)  Component Value Ref Range  Ferritin 40 11 - 204 ng/mL   Ferritin (10/26/2015 3:41 PM)  Specimen Performing Laboratory  Blood

## 2015-10-29 NOTE — Telephone Encounter (Signed)
I cannot see that a hbg was drawn, but I think she'll be ok. please schedule appt with me for lab and feraheme in 3 months.

## 2015-10-29 NOTE — Telephone Encounter (Signed)
3 month appt scheduled and patient is aware.

## 2016-01-29 ENCOUNTER — Other Ambulatory Visit: Payer: Self-pay | Admitting: *Deleted

## 2016-01-29 DIAGNOSIS — D509 Iron deficiency anemia, unspecified: Secondary | ICD-10-CM

## 2016-02-01 ENCOUNTER — Inpatient Hospital Stay: Payer: BLUE CROSS/BLUE SHIELD

## 2016-02-01 ENCOUNTER — Inpatient Hospital Stay: Payer: BLUE CROSS/BLUE SHIELD | Admitting: Oncology

## 2016-02-03 ENCOUNTER — Inpatient Hospital Stay: Payer: BLUE CROSS/BLUE SHIELD | Attending: Oncology

## 2016-02-03 ENCOUNTER — Inpatient Hospital Stay (HOSPITAL_BASED_OUTPATIENT_CLINIC_OR_DEPARTMENT_OTHER): Payer: BLUE CROSS/BLUE SHIELD | Admitting: Oncology

## 2016-02-03 ENCOUNTER — Inpatient Hospital Stay: Payer: BLUE CROSS/BLUE SHIELD

## 2016-02-03 VITALS — BP 115/76 | HR 93 | Temp 97.2°F | Resp 18 | Wt 173.5 lb

## 2016-02-03 VITALS — BP 108/71 | HR 77 | Resp 18

## 2016-02-03 DIAGNOSIS — C50912 Malignant neoplasm of unspecified site of left female breast: Secondary | ICD-10-CM | POA: Insufficient documentation

## 2016-02-03 DIAGNOSIS — D509 Iron deficiency anemia, unspecified: Secondary | ICD-10-CM | POA: Diagnosis not present

## 2016-02-03 DIAGNOSIS — Z170 Estrogen receptor positive status [ER+]: Secondary | ICD-10-CM | POA: Diagnosis not present

## 2016-02-03 DIAGNOSIS — E538 Deficiency of other specified B group vitamins: Secondary | ICD-10-CM | POA: Insufficient documentation

## 2016-02-03 DIAGNOSIS — K5900 Constipation, unspecified: Secondary | ICD-10-CM | POA: Diagnosis not present

## 2016-02-03 DIAGNOSIS — R42 Dizziness and giddiness: Secondary | ICD-10-CM | POA: Diagnosis not present

## 2016-02-03 DIAGNOSIS — Z9012 Acquired absence of left breast and nipple: Secondary | ICD-10-CM | POA: Insufficient documentation

## 2016-02-03 DIAGNOSIS — Z79810 Long term (current) use of selective estrogen receptor modulators (SERMs): Secondary | ICD-10-CM | POA: Insufficient documentation

## 2016-02-03 DIAGNOSIS — Z79899 Other long term (current) drug therapy: Secondary | ICD-10-CM | POA: Insufficient documentation

## 2016-02-03 LAB — CBC WITH DIFFERENTIAL/PLATELET
BASOS ABS: 0.1 10*3/uL (ref 0–0.1)
BASOS PCT: 2 %
Eosinophils Absolute: 0.2 10*3/uL (ref 0–0.7)
Eosinophils Relative: 3 %
HEMATOCRIT: 38.6 % (ref 35.0–47.0)
HEMOGLOBIN: 13.1 g/dL (ref 12.0–16.0)
Lymphocytes Relative: 24 %
Lymphs Abs: 1.6 10*3/uL (ref 1.0–3.6)
MCH: 28.9 pg (ref 26.0–34.0)
MCHC: 34 g/dL (ref 32.0–36.0)
MCV: 85.3 fL (ref 80.0–100.0)
Monocytes Absolute: 0.5 10*3/uL (ref 0.2–0.9)
Monocytes Relative: 8 %
NEUTROS ABS: 4.2 10*3/uL (ref 1.4–6.5)
Neutrophils Relative %: 63 %
Platelets: 247 10*3/uL (ref 150–440)
RBC: 4.53 MIL/uL (ref 3.80–5.20)
RDW: 13.7 % (ref 11.5–14.5)
WBC: 6.5 10*3/uL (ref 3.6–11.0)

## 2016-02-03 LAB — IRON AND TIBC
IRON: 48 ug/dL (ref 28–170)
SATURATION RATIOS: 13 % (ref 10.4–31.8)
TIBC: 375 ug/dL (ref 250–450)
UIBC: 327 ug/dL

## 2016-02-03 LAB — FERRITIN: FERRITIN: 10 ng/mL — AB (ref 11–307)

## 2016-02-03 MED ORDER — SODIUM CHLORIDE 0.9 % IV SOLN
510.0000 mg | Freq: Once | INTRAVENOUS | Status: AC
  Administered 2016-02-03: 510 mg via INTRAVENOUS
  Filled 2016-02-03: qty 17

## 2016-02-03 MED ORDER — SODIUM CHLORIDE 0.9 % IV SOLN
Freq: Once | INTRAVENOUS | Status: AC
  Administered 2016-02-03: 11:00:00 via INTRAVENOUS
  Filled 2016-02-03: qty 1000

## 2016-02-09 NOTE — Progress Notes (Signed)
[place]  Telephone:(336) [phone] Fax:(336) [phone]  ID: Meghan Bush OB: 1965-11-30  MR#: [id number]  [id number]  Patient Care Team: No Pcp Per Patient as PCP - General (General Practice)  CHIEF COMPLAINT:  Chief Complaint  Patient presents with  . IDA    INTERVAL HISTORY: Patient last evaluated in August 2016. She is referred back for declining hemoglobin and iron stores. She continues to complain of chronic weakness and fatigue.  She has no neurologic complaints.  She has a good appetite and denies weight lost.  She denies any recent fevers or illnesses.  She has no chest pain or shortness of breath.  She denies any nausea, vomiting, constipation, or diarrhea.  She has no melena or hematochezia.  She has no urinary complaints.  Patient offers no further specific complaints.  REVIEW OF SYSTEMS:   Review of Systems  Constitutional: Negative for fever, weight loss and malaise/fatigue.  Respiratory: Negative.  Negative for cough, hemoptysis and shortness of breath.   Cardiovascular: Negative.  Negative for chest pain.  Gastrointestinal: Negative.  Negative for blood in stool and melena.  Genitourinary: Negative.   Musculoskeletal: Negative.   Neurological: Negative.  Negative for weakness.    As per HPI. Otherwise, a complete review of systems is negatve.  PAST MEDICAL HISTORY:  Stage I breast cancer, depression status post ECT, anxiety, retinal detachment, migraines, right knee surgery x3.  Past Medical History  Diagnosis Date  . Vertigo   . Migraine headache   . Nephrolithiasis   . Cancer [facility])     breast- s/p left mastectomy on tamoxifen  . Anxiety   . Anemia   . Depression   . Vitamin B12 deficiency   . Chronic constipation   . Localized swelling, mass and lump, trunk     PAST SURGICAL HISTORY: Past Surgical History  Procedure Laterality Date  . Dilation and curettage of uterus    . Hysteroscopy    . Breast reconstruction Left   .  Modified radical mastectomy    . Knee surgery    . Eye surgery    . Ect treatments    . Surgery of lip      FAMILY HISTORY:  Father with prostate cancer.     ADVANCED DIRECTIVES:    HEALTH MAINTENANCE: Social History  Substance Use Topics  . Smoking status: Never Smoker   . Smokeless tobacco: Never Used  . Alcohol Use: No     Colonoscopy:  PAP:  Bone density:  Lipid panel:  Allergies  Allergen Reactions  . Clindamycin/Lincomycin Hives  . Klonopin [Clonazepam] Hives  . Percocet [Oxycodone-Acetaminophen] Other (See Comments)    halucinate    Current Outpatient Prescriptions  Medication Sig Dispense Refill  . Bisacodyl (LAXATIVE PO) Take 1 tablet by mouth daily.    . calcium elemental as carbonate (PX ANTACID MAXIMUM STRENGTH) 400 MG chewable tablet Chew by mouth.    . Calcium-Vitamin D 500-125 MG-UNIT TABS Take 1 tablet by mouth daily.    . cetirizine (ZYRTEC) 10 MG tablet Take by mouth.    . famotidine (PEPCID) 40 MG tablet     . Lactobacillus (PROBIOTIC ACIDOPHILUS PO) Take by mouth.    Marland Kitchen LORazepam (ATIVAN) 2 MG tablet Take 2 mg by mouth 3 (three) times daily as needed for anxiety.    . nortriptyline (PAMELOR) 75 MG capsule Take 150 mg by mouth at bedtime.    . pantoprazole (PROTONIX) 40 MG tablet Take 40 mg by mouth 2 (two) times daily.    Marland Kitchen  traZODone (DESYREL) 100 MG tablet Take 200 mg by mouth at bedtime.    . cyanocobalamin 500 MCG tablet Take 500 mcg by mouth daily. Reported on 02/03/2016    . fluticasone (FLONASE) 50 MCG/ACT nasal spray Place 2 sprays into both nostrils daily. Reported on 02/03/2016    . Ginger, Zingiber officinalis, (GINGER ROOT) 500 MG CAPS Take by mouth. Reported on 02/03/2016    . tamoxifen (NOLVADEX) 20 MG tablet Take 20 mg by mouth at bedtime. Reported on 02/03/2016     No current facility-administered medications for this visit.    OBJECTIVE: Filed Vitals:   02/03/16 0943  BP: 115/76  Pulse: 93  Temp: 97.2 F (36.2 C)  Resp: 18      Body mass index is 28.02 kg/(m^2).    ECOG FS:0 - Asymptomatic  General: Well-developed, well-nourished, no acute distress. Eyes: Pink conjunctiva, anicteric sclera. Lungs: Clear to auscultation bilaterally. Heart: Regular rate and rhythm. No rubs, murmurs, or gallops. Abdomen: Soft, nontender, nondistended. No organomegaly noted, normoactive bowel sounds. Musculoskeletal: No edema, cyanosis, or clubbing. Neuro: Alert, answering all questions appropriately. Cranial nerves grossly intact. Skin: No rashes or petechiae noted. Psych: Normal affect.  LAB RESULTS:  Lab Results  Component Value Date   NA 141 01/08/2015   K 3.9 01/08/2015   CL 105 01/08/2015   CO2 28 01/08/2015   GLUCOSE 94 01/08/2015   BUN 10 01/08/2015   CREATININE 0.84 01/08/2015   CALCIUM 9.3 01/08/2015   PROT 6.7 01/08/2015   ALBUMIN 3.8 01/08/2015   AST 23 01/08/2015   ALT 30 01/08/2015   ALKPHOS 45 01/08/2015   BILITOT <0.1* 01/08/2015   GFRNONAA >60 01/08/2015   GFRAA >60 01/08/2015    Lab Results  Component Value Date   WBC 6.5 02/03/2016   NEUTROABS 4.2 02/03/2016   HGB 13.1 02/03/2016   HCT 38.6 02/03/2016   MCV 85.3 02/03/2016   PLT 247 02/03/2016   Lab Results  Component Value Date   IRON 48 02/03/2016   TIBC 375 02/03/2016   IRONPCTSAT 13 02/03/2016    Lab Results  Component Value Date   FERRITIN 10* 02/03/2016     STUDIES: No results found.  ASSESSMENT: Iron deficiency anemia, breast cancer.  PLAN:    1.  Iron deficiency anemia: Although patient's hemoglobin is within normal limits, her iron stores are borderline low and she is symptomatic. Proceed with 510 mg of IV Feraheme today. Given her heavy menses, she likely will require periodic Feraheme.  Patient last received IV iron in July 2015. Return to clinic in 6 months with repeat laboratory work and further evaluation.  2. Breast cancer: Patient currently taking tamoxifen. Treatment per [facility]. Patient had multiple  questions regarding her breast cancer and treatment which were answered to her satisfaction.  Patient expressed understanding and was in agreement with this plan. She also understands that She can call clinic at any time with any questions, concerns, or complaints.     Lloyd Huger, MD   02/09/2016 1:29 PM

## 2016-06-07 ENCOUNTER — Encounter: Payer: BLUE CROSS/BLUE SHIELD | Admitting: Obstetrics and Gynecology

## 2016-06-13 ENCOUNTER — Telehealth: Payer: Self-pay | Admitting: *Deleted

## 2016-06-13 NOTE — Telephone Encounter (Signed)
Had labs checked at [facility] and wants you to be aware of them

## 2016-06-13 NOTE — Telephone Encounter (Signed)
Left msg on VM that per Dr Grayland Ormond her labs look good and she does not need anything

## 2016-07-31 NOTE — Progress Notes (Deleted)
[place]  [phone] [phone]  ID: Jaliyah Munier OB: [birth year]  MR#: [id number]  [id number]  Patient Care Team: No Pcp Per Patient as PCP - General (General Practice)  CHIEF COMPLAINT: Iron deficiency anemia  INTERVAL HISTORY: Patient last evaluated in August 2016. She is referred back for declining hemoglobin and iron stores. She continues to complain of chronic weakness and fatigue.  She has no neurologic complaints.  She has a good appetite and denies weight lost.  She denies any recent fevers or illnesses.  She has no chest pain or shortness of breath.  She denies any nausea, vomiting, constipation, or diarrhea.  She has no melena or hematochezia.  She has no urinary complaints.  Patient offers no further specific complaints.  REVIEW OF SYSTEMS:   Review of Systems  Constitutional: Negative for fever, malaise/fatigue and weight loss.  Respiratory: Negative.  Negative for cough, hemoptysis and shortness of breath.   Cardiovascular: Negative.  Negative for chest pain.  Gastrointestinal: Negative.  Negative for blood in stool and melena.  Genitourinary: Negative.   Musculoskeletal: Negative.   Neurological: Negative.  Negative for weakness.    As per HPI. Otherwise, a complete review of systems is negatve.  PAST MEDICAL HISTORY:  Stage I breast cancer, depression status post ECT, anxiety, retinal detachment, migraines, right knee surgery x3.  Past Medical History:  Diagnosis Date  . Anemia   . Anxiety   . Cancer [facility])    breast- s/p left mastectomy on tamoxifen  . Chronic constipation   . Depression   . Localized swelling, mass and lump, trunk   . Migraine headache   . Nephrolithiasis   . Vertigo   . Vitamin B12 deficiency     PAST SURGICAL HISTORY: Past Surgical History:  Procedure Laterality Date  . BREAST RECONSTRUCTION Left   . DILATION AND CURETTAGE OF UTERUS    . ect treatments    . EYE SURGERY    .  HYSTEROSCOPY    . KNEE SURGERY    . MODIFIED RADICAL MASTECTOMY    . SURGERY OF LIP      FAMILY HISTORY:  Father with prostate cancer.     ADVANCED DIRECTIVES:    HEALTH MAINTENANCE: Social History  Substance Use Topics  . Smoking status: Never Smoker  . Smokeless tobacco: Never Used  . Alcohol use No     Colonoscopy:  PAP:  Bone density:  Lipid panel:  Allergies  Allergen Reactions  . Clindamycin/Lincomycin Hives  . Klonopin [Clonazepam] Hives  . Percocet [Oxycodone-Acetaminophen] Other (See Comments)    halucinate    Current Outpatient Prescriptions  Medication Sig Dispense Refill  . Bisacodyl (LAXATIVE PO) Take 1 tablet by mouth daily.    . calcium elemental as carbonate (PX ANTACID MAXIMUM STRENGTH) 400 MG chewable tablet Chew by mouth.    . Calcium-Vitamin D 500-125 MG-UNIT TABS Take 1 tablet by mouth daily.    . cetirizine (ZYRTEC) 10 MG tablet Take by mouth.    . cyanocobalamin 500 MCG tablet Take 500 mcg by mouth daily. Reported on 02/03/2016    . famotidine (PEPCID) 40 MG tablet     . fluticasone (FLONASE) 50 MCG/ACT nasal spray Place 2 sprays into both nostrils daily. Reported on 02/03/2016    . Ginger, Zingiber officinalis, (GINGER ROOT) 500 MG CAPS Take by mouth. Reported on 02/03/2016    . Lactobacillus (PROBIOTIC ACIDOPHILUS PO) Take by mouth.    Marland Kitchen LORazepam (ATIVAN) 2 MG tablet Take 2 mg  by mouth 3 (three) times daily as needed for anxiety.    . nortriptyline (PAMELOR) 75 MG capsule Take 150 mg by mouth at bedtime.    . pantoprazole (PROTONIX) 40 MG tablet Take 40 mg by mouth 2 (two) times daily.    . tamoxifen (NOLVADEX) 20 MG tablet Take 20 mg by mouth at bedtime. Reported on 02/03/2016    . traZODone (DESYREL) 100 MG tablet Take 200 mg by mouth at bedtime.     No current facility-administered medications for this visit.     OBJECTIVE: There were no vitals filed for this visit.   There is no height or weight on file to calculate BMI.    ECOG FS:0 -  Asymptomatic  General: Well-developed, well-nourished, no acute distress. Eyes: Pink conjunctiva, anicteric sclera. Lungs: Clear to auscultation bilaterally. Heart: Regular rate and rhythm. No rubs, murmurs, or gallops. Abdomen: Soft, nontender, nondistended. No organomegaly noted, normoactive bowel sounds. Musculoskeletal: No edema, cyanosis, or clubbing. Neuro: Alert, answering all questions appropriately. Cranial nerves grossly intact. Skin: No rashes or petechiae noted. Psych: Normal affect.  LAB RESULTS:  Lab Results  Component Value Date   NA 141 01/08/2015   K 3.9 01/08/2015   CL 105 01/08/2015   CO2 28 01/08/2015   GLUCOSE 94 01/08/2015   BUN 10 01/08/2015   CREATININE 0.84 01/08/2015   CALCIUM 9.3 01/08/2015   PROT 6.7 01/08/2015   ALBUMIN 3.8 01/08/2015   AST 23 01/08/2015   ALT 30 01/08/2015   ALKPHOS 45 01/08/2015   BILITOT <0.1 (L) 01/08/2015   GFRNONAA >60 01/08/2015   GFRAA >60 01/08/2015    Lab Results  Component Value Date   WBC 6.5 02/03/2016   NEUTROABS 4.2 02/03/2016   HGB 13.1 02/03/2016   HCT 38.6 02/03/2016   MCV 85.3 02/03/2016   PLT 247 02/03/2016   Lab Results  Component Value Date   IRON 48 02/03/2016   TIBC 375 02/03/2016   IRONPCTSAT 13 02/03/2016    Lab Results  Component Value Date   FERRITIN 10 (L) 02/03/2016     STUDIES: No results found.  ASSESSMENT: Iron deficiency anemia, breast cancer.  PLAN:    1.  Iron deficiency anemia: Although patient's hemoglobin is within normal limits, her iron stores are borderline low and she is symptomatic. Proceed with 510 mg of IV Feraheme today. Given her heavy menses, she likely will require periodic Feraheme.  Patient last received IV iron in July 2015. Return to clinic in 6 months with repeat laboratory work and further evaluation.  2. Breast cancer: Patient currently taking tamoxifen. Treatment per [facility]. Patient had multiple questions regarding her breast cancer and  treatment which were answered to her satisfaction.  Patient expressed understanding and was in agreement with this plan. She also understands that She can call clinic at any time with any questions, concerns, or complaints.     Lloyd Huger, MD   07/31/2016 10:50 PM

## 2016-08-01 ENCOUNTER — Inpatient Hospital Stay: Payer: BLUE CROSS/BLUE SHIELD

## 2016-08-01 ENCOUNTER — Inpatient Hospital Stay: Payer: BLUE CROSS/BLUE SHIELD | Admitting: Oncology

## 2016-08-03 ENCOUNTER — Ambulatory Visit: Payer: BLUE CROSS/BLUE SHIELD | Admitting: Oncology

## 2016-08-03 ENCOUNTER — Ambulatory Visit: Payer: BLUE CROSS/BLUE SHIELD

## 2016-08-03 ENCOUNTER — Other Ambulatory Visit: Payer: BLUE CROSS/BLUE SHIELD

## 2016-08-31 ENCOUNTER — Other Ambulatory Visit: Payer: BLUE CROSS/BLUE SHIELD

## 2016-08-31 ENCOUNTER — Ambulatory Visit: Payer: BLUE CROSS/BLUE SHIELD | Admitting: Oncology

## 2016-08-31 ENCOUNTER — Ambulatory Visit: Payer: BLUE CROSS/BLUE SHIELD

## 2016-10-25 ENCOUNTER — Telehealth: Payer: Self-pay | Admitting: *Deleted

## 2016-10-25 NOTE — Telephone Encounter (Signed)
Per Dr Grayland Ormond, schedule appt for MD and [facility]. Message sent to scheduling, patient informed to expect a call, message left on her VM

## 2016-10-25 NOTE — Telephone Encounter (Signed)
She had her labs checked at [facility] and her Ferritin is 9. Requesting intervention for it. Please advise

## 2016-10-30 NOTE — Progress Notes (Signed)
[place]  [phone]) [phone] [phone]) [phone]  ID: Meghan Bush OB: 07/13/1966  MR#: [id number]  [id number]#:[id number]  Patient Care Team: Derinda Late, MD as PCP - General (Family Medicine)  CHIEF COMPLAINT: Iron deficiency anemia, breast cancer.  INTERVAL HISTORY: Patient returns to clinic today for further evaluation and consideration of IV Feraheme. She missed her appointment approximately one month ago secondary to endometrial ablation procedure. She currently feels weak and fatigued and has occasional dizziness. She also has increased joint pain which she attributes to iron deficiency. She otherwise feels well. She has no neurologic complaints.  She has a good appetite and denies weight lost.  She denies any recent fevers or illnesses.  She has no chest pain or shortness of breath.  She denies any nausea, vomiting, constipation, or diarrhea.  She has no melena or hematochezia.  She has no urinary complaints.  Patient offers no further specific complaints.  REVIEW OF SYSTEMS:   Review of Systems  Constitutional: Positive for malaise/fatigue. Negative for fever and weight loss.  Respiratory: Negative.  Negative for cough, hemoptysis and shortness of breath.   Cardiovascular: Negative.  Negative for chest pain.  Gastrointestinal: Negative.  Negative for abdominal pain, blood in stool and melena.  Genitourinary: Negative.   Musculoskeletal: Positive for joint pain.  Neurological: Positive for dizziness and weakness.  Psychiatric/Behavioral: Negative.  The patient is not nervous/anxious.     As per HPI. Otherwise, a complete review of systems is negative.  PAST MEDICAL HISTORY:  Stage I breast cancer, depression status post ECT, anxiety, retinal detachment, migraines, right knee surgery x3.   Past Medical History:  Diagnosis Date  . Anemia   . Anxiety   . Cancer [facility])    breast- s/p left mastectomy on tamoxifen  . Chronic constipation   . Depression     . Localized swelling, mass and lump, trunk   . Migraine headache   . Nephrolithiasis   . Vertigo   . Vitamin B12 deficiency     PAST SURGICAL HISTORY: Past Surgical History:  Procedure Laterality Date  . BREAST RECONSTRUCTION Left   . DILATION AND CURETTAGE OF UTERUS    . ect treatments    . EYE SURGERY    . HYSTEROSCOPY    . KNEE SURGERY    . MODIFIED RADICAL MASTECTOMY    . SURGERY OF LIP      FAMILY HISTORY:  Father with prostate cancer.     ADVANCED DIRECTIVES:    HEALTH MAINTENANCE: Social History  Substance Use Topics  . Smoking status: Never Smoker  . Smokeless tobacco: Never Used  . Alcohol use No     Colonoscopy:  PAP:  Bone density:  Lipid panel:  Allergies  Allergen Reactions  . Clindamycin/Lincomycin Hives  . Klonopin [Clonazepam] Hives  . Percocet [Oxycodone-Acetaminophen] Other (See Comments)    halucinate    Current Outpatient Prescriptions  Medication Sig Dispense Refill  . Bisacodyl (LAXATIVE PO) Take 1 tablet by mouth daily.    . calcium elemental as carbonate (PX ANTACID MAXIMUM STRENGTH) 400 MG chewable tablet Chew by mouth as needed.     . Calcium-Vitamin D 500-125 MG-UNIT TABS Take 1 tablet by mouth daily.    . cetirizine (ZYRTEC) 10 MG tablet Take 10 mg by mouth daily.     . famotidine (PEPCID) 40 MG tablet Take 40 mg by mouth 2 (two) times daily.     . fluticasone (FLONASE) 50 MCG/ACT nasal spray Place 2 sprays into both nostrils daily  as needed. Reported on 02/03/2016    . Lactobacillus (PROBIOTIC ACIDOPHILUS PO) Take 1 capsule by mouth daily.     Marland Kitchen LORazepam (ATIVAN) 2 MG tablet Take 2 mg by mouth 3 (three) times daily as needed for anxiety.    . nortriptyline (PAMELOR) 75 MG capsule Take 150 mg by mouth at bedtime.    . pantoprazole (PROTONIX) 40 MG tablet Take 40 mg by mouth 2 (two) times daily.    . traZODone (DESYREL) 100 MG tablet Take 200 mg by mouth at bedtime.     No current facility-administered medications for this  visit.    Facility-Administered Medications Ordered in Other Visits  Medication Dose Route Frequency Provider Last Rate Last Dose  . 0.9 %  sodium chloride infusion   Intravenous Once Lloyd Huger, MD      . ferumoxytol [facility]) 510 mg in sodium chloride 0.9 % 100 mL IVPB  510 mg Intravenous Once Lloyd Huger, MD        OBJECTIVE: Vitals:   10/31/16 0912  BP: 129/83  Pulse: 97  Resp: 18  Temp: 97.4 F (36.3 C)     Body mass index is 29.37 kg/m.    ECOG FS:0 - Asymptomatic  General: Well-developed, well-nourished, no acute distress. Eyes: Pink conjunctiva, anicteric sclera. Lungs: Clear to auscultation bilaterally. Heart: Regular rate and rhythm. No rubs, murmurs, or gallops. Abdomen: Soft, nontender, nondistended. No organomegaly noted, normoactive bowel sounds. Musculoskeletal: No edema, cyanosis, or clubbing. Neuro: Alert, answering all questions appropriately. Cranial nerves grossly intact. Skin: No rashes or petechiae noted. Psych: Normal affect.  LAB RESULTS:  Lab Results  Component Value Date   NA 141 01/08/2015   K 3.9 01/08/2015   CL 105 01/08/2015   CO2 28 01/08/2015   GLUCOSE 94 01/08/2015   BUN 10 01/08/2015   CREATININE 0.84 01/08/2015   CALCIUM 9.3 01/08/2015   PROT 6.7 01/08/2015   ALBUMIN 3.8 01/08/2015   AST 23 01/08/2015   ALT 30 01/08/2015   ALKPHOS 45 01/08/2015   BILITOT <0.1 (L) 01/08/2015   GFRNONAA >60 01/08/2015   GFRAA >60 01/08/2015    Lab Results  Component Value Date   WBC 6.5 02/03/2016   NEUTROABS 4.2 02/03/2016   HGB 13.1 02/03/2016   HCT 38.6 02/03/2016   MCV 85.3 02/03/2016   PLT 247 02/03/2016   Lab Results  Component Value Date   IRON 48 02/03/2016   TIBC 375 02/03/2016   IRONPCTSAT 13 02/03/2016    Lab Results  Component Value Date   FERRITIN 10 (L) 02/03/2016     STUDIES: No results found.  ASSESSMENT: Iron deficiency anemia, breast cancer.  PLAN:    1.  Iron deficiency anemia: Patient's  most recent iron stores are significantly reduced and she is symptomatic. We do not have a recently documented hemoglobin. Given her heavy menses, she likely will require periodic Feraheme. Proceed with 510 mg of IV Feraheme today.  Return to clinic in 1 week for Feraheme only and then in 3 months for repeat laboratory work and further evaluation.  2. Breast cancer: Patient currently taking tamoxifen. Treatment per [facility]   Patient expressed understanding and was in agreement with this plan. She also understands that She can call clinic at any time with any questions, concerns, or complaints.     Lloyd Huger, MD   10/31/2016 9:36 AM

## 2016-10-31 ENCOUNTER — Inpatient Hospital Stay: Payer: BLUE CROSS/BLUE SHIELD

## 2016-10-31 ENCOUNTER — Encounter (INDEPENDENT_AMBULATORY_CARE_PROVIDER_SITE_OTHER): Payer: Self-pay

## 2016-10-31 ENCOUNTER — Inpatient Hospital Stay: Payer: BLUE CROSS/BLUE SHIELD | Attending: Oncology | Admitting: Oncology

## 2016-10-31 VITALS — BP 129/83 | HR 97 | Temp 97.4°F | Resp 18 | Wt 182.0 lb

## 2016-10-31 DIAGNOSIS — F419 Anxiety disorder, unspecified: Secondary | ICD-10-CM | POA: Diagnosis not present

## 2016-10-31 DIAGNOSIS — F329 Major depressive disorder, single episode, unspecified: Secondary | ICD-10-CM | POA: Insufficient documentation

## 2016-10-31 DIAGNOSIS — C50919 Malignant neoplasm of unspecified site of unspecified female breast: Secondary | ICD-10-CM | POA: Insufficient documentation

## 2016-10-31 DIAGNOSIS — N920 Excessive and frequent menstruation with regular cycle: Secondary | ICD-10-CM | POA: Diagnosis not present

## 2016-10-31 DIAGNOSIS — Z9012 Acquired absence of left breast and nipple: Secondary | ICD-10-CM | POA: Insufficient documentation

## 2016-10-31 DIAGNOSIS — K5909 Other constipation: Secondary | ICD-10-CM | POA: Insufficient documentation

## 2016-10-31 DIAGNOSIS — D508 Other iron deficiency anemias: Secondary | ICD-10-CM

## 2016-10-31 DIAGNOSIS — Z170 Estrogen receptor positive status [ER+]: Secondary | ICD-10-CM | POA: Insufficient documentation

## 2016-10-31 DIAGNOSIS — D509 Iron deficiency anemia, unspecified: Secondary | ICD-10-CM | POA: Diagnosis not present

## 2016-10-31 DIAGNOSIS — Z79810 Long term (current) use of selective estrogen receptor modulators (SERMs): Secondary | ICD-10-CM

## 2016-10-31 DIAGNOSIS — Z79899 Other long term (current) drug therapy: Secondary | ICD-10-CM | POA: Diagnosis not present

## 2016-10-31 MED ORDER — SODIUM CHLORIDE 0.9 % IV SOLN
510.0000 mg | Freq: Once | INTRAVENOUS | Status: AC
  Administered 2016-10-31: 510 mg via INTRAVENOUS
  Filled 2016-10-31: qty 17

## 2016-10-31 MED ORDER — SODIUM CHLORIDE 0.9 % IV SOLN
Freq: Once | INTRAVENOUS | Status: AC
  Administered 2016-10-31: 10:00:00 via INTRAVENOUS
  Filled 2016-10-31: qty 1000

## 2016-10-31 NOTE — Progress Notes (Signed)
Complains of feeling dizzy, fatigue, and weak today due to low iron levels. Having joint pain as well.

## 2016-11-04 ENCOUNTER — Other Ambulatory Visit: Payer: Self-pay | Admitting: Oncology

## 2016-11-07 ENCOUNTER — Inpatient Hospital Stay: Payer: BLUE CROSS/BLUE SHIELD

## 2016-11-07 VITALS — BP 118/77 | HR 87 | Temp 97.7°F | Resp 16

## 2016-11-07 DIAGNOSIS — D508 Other iron deficiency anemias: Secondary | ICD-10-CM

## 2016-11-07 DIAGNOSIS — D509 Iron deficiency anemia, unspecified: Secondary | ICD-10-CM | POA: Diagnosis not present

## 2016-11-07 MED ORDER — SODIUM CHLORIDE 0.9 % IV SOLN
Freq: Once | INTRAVENOUS | Status: AC
  Administered 2016-11-07: 15:00:00 via INTRAVENOUS
  Filled 2016-11-07: qty 1000

## 2016-11-07 MED ORDER — SODIUM CHLORIDE 0.9 % IV SOLN
510.0000 mg | Freq: Once | INTRAVENOUS | Status: AC
  Administered 2016-11-07: 510 mg via INTRAVENOUS
  Filled 2016-11-07: qty 17

## 2017-01-30 ENCOUNTER — Ambulatory Visit: Payer: BLUE CROSS/BLUE SHIELD | Admitting: Oncology

## 2017-01-30 ENCOUNTER — Inpatient Hospital Stay: Payer: BLUE CROSS/BLUE SHIELD

## 2017-02-03 ENCOUNTER — Inpatient Hospital Stay: Payer: BLUE CROSS/BLUE SHIELD | Attending: Oncology

## 2017-02-03 ENCOUNTER — Inpatient Hospital Stay: Payer: BLUE CROSS/BLUE SHIELD

## 2017-02-03 DIAGNOSIS — Z79810 Long term (current) use of selective estrogen receptor modulators (SERMs): Secondary | ICD-10-CM | POA: Insufficient documentation

## 2017-02-03 DIAGNOSIS — C50919 Malignant neoplasm of unspecified site of unspecified female breast: Secondary | ICD-10-CM | POA: Diagnosis not present

## 2017-02-03 DIAGNOSIS — Z170 Estrogen receptor positive status [ER+]: Secondary | ICD-10-CM | POA: Diagnosis not present

## 2017-02-03 DIAGNOSIS — Z79899 Other long term (current) drug therapy: Secondary | ICD-10-CM | POA: Diagnosis not present

## 2017-02-03 DIAGNOSIS — N920 Excessive and frequent menstruation with regular cycle: Secondary | ICD-10-CM | POA: Diagnosis not present

## 2017-02-03 DIAGNOSIS — Z9012 Acquired absence of left breast and nipple: Secondary | ICD-10-CM | POA: Diagnosis not present

## 2017-02-03 DIAGNOSIS — D508 Other iron deficiency anemias: Secondary | ICD-10-CM

## 2017-02-03 DIAGNOSIS — D509 Iron deficiency anemia, unspecified: Secondary | ICD-10-CM | POA: Insufficient documentation

## 2017-02-03 LAB — FERRITIN: FERRITIN: 101 ng/mL (ref 11–307)

## 2017-02-03 LAB — CBC WITH DIFFERENTIAL/PLATELET
BASOS ABS: 0.1 10*3/uL (ref 0–0.1)
Basophils Relative: 1 %
Eosinophils Absolute: 0.2 10*3/uL (ref 0–0.7)
Eosinophils Relative: 2 %
HEMATOCRIT: 41 % (ref 35.0–47.0)
HEMOGLOBIN: 13.9 g/dL (ref 12.0–16.0)
LYMPHS ABS: 1.3 10*3/uL (ref 1.0–3.6)
LYMPHS PCT: 21 %
MCH: 30.4 pg (ref 26.0–34.0)
MCHC: 33.9 g/dL (ref 32.0–36.0)
MCV: 89.5 fL (ref 80.0–100.0)
Monocytes Absolute: 0.5 10*3/uL (ref 0.2–0.9)
Monocytes Relative: 7 %
NEUTROS ABS: 4.5 10*3/uL (ref 1.4–6.5)
NEUTROS PCT: 69 %
Platelets: 235 10*3/uL (ref 150–440)
RBC: 4.58 MIL/uL (ref 3.80–5.20)
RDW: 14.2 % (ref 11.5–14.5)
WBC: 6.5 10*3/uL (ref 3.6–11.0)

## 2017-02-03 LAB — IRON AND TIBC
Iron: 96 ug/dL (ref 28–170)
SATURATION RATIOS: 30 % (ref 10.4–31.8)
TIBC: 320 ug/dL (ref 250–450)
UIBC: 224 ug/dL

## 2017-02-03 NOTE — Progress Notes (Unsigned)
Pt's ferritin and iron studies within normal limits today. Spoke with Meghan Bush, CMA about lab results and if she needs to come in this afternoon for feraheme. Per Almyra Free, Dr Grayland Ormond stated pt did not need to come in this afternoon for treatment. Called and notified patient that she did not need feraheme this afternoon. Pt verbalized understanding.

## 2022-05-27 ENCOUNTER — Other Ambulatory Visit (HOSPITAL_COMMUNITY): Payer: Self-pay

## 2022-05-31 ENCOUNTER — Other Ambulatory Visit (HOSPITAL_COMMUNITY): Payer: Self-pay
